# Patient Record
Sex: Male | Born: 1955 | Race: Black or African American | Hispanic: No | Marital: Single | Smoking: Never smoker
Health system: Southern US, Community
[De-identification: ages and names within clinical notes are randomized; demographics above are authoritative.]

## PROBLEM LIST (undated history)

## (undated) DIAGNOSIS — I10 Essential (primary) hypertension: Secondary | ICD-10-CM

## (undated) DIAGNOSIS — E785 Hyperlipidemia, unspecified: Secondary | ICD-10-CM

## (undated) DIAGNOSIS — I639 Cerebral infarction, unspecified: Secondary | ICD-10-CM

## (undated) HISTORY — DX: Essential (primary) hypertension: I10

## (undated) HISTORY — PX: OTHER SURGICAL HISTORY: SHX169

## (undated) HISTORY — DX: Hyperlipidemia, unspecified: E78.5

---

## 2021-04-25 DIAGNOSIS — I10 Essential (primary) hypertension: Secondary | ICD-10-CM | POA: Diagnosis not present

## 2021-04-25 DIAGNOSIS — N529 Male erectile dysfunction, unspecified: Secondary | ICD-10-CM | POA: Diagnosis not present

## 2021-04-25 DIAGNOSIS — G8190 Hemiplegia, unspecified affecting unspecified side: Secondary | ICD-10-CM | POA: Diagnosis not present

## 2021-04-25 DIAGNOSIS — R4781 Slurred speech: Secondary | ICD-10-CM | POA: Diagnosis not present

## 2021-04-27 DIAGNOSIS — I10 Essential (primary) hypertension: Secondary | ICD-10-CM | POA: Diagnosis not present

## 2021-04-27 DIAGNOSIS — Z125 Encounter for screening for malignant neoplasm of prostate: Secondary | ICD-10-CM | POA: Diagnosis not present

## 2021-04-27 DIAGNOSIS — E785 Hyperlipidemia, unspecified: Secondary | ICD-10-CM | POA: Diagnosis not present

## 2021-04-27 DIAGNOSIS — R739 Hyperglycemia, unspecified: Secondary | ICD-10-CM | POA: Diagnosis not present

## 2021-05-09 DIAGNOSIS — G8190 Hemiplegia, unspecified affecting unspecified side: Secondary | ICD-10-CM | POA: Diagnosis not present

## 2021-05-09 DIAGNOSIS — N1831 Chronic kidney disease, stage 3a: Secondary | ICD-10-CM | POA: Diagnosis not present

## 2021-05-09 DIAGNOSIS — I10 Essential (primary) hypertension: Secondary | ICD-10-CM | POA: Diagnosis not present

## 2021-05-09 DIAGNOSIS — R4781 Slurred speech: Secondary | ICD-10-CM | POA: Diagnosis not present

## 2021-05-16 DIAGNOSIS — N1831 Chronic kidney disease, stage 3a: Secondary | ICD-10-CM | POA: Diagnosis not present

## 2021-05-16 DIAGNOSIS — I10 Essential (primary) hypertension: Secondary | ICD-10-CM | POA: Diagnosis not present

## 2021-05-16 DIAGNOSIS — G8190 Hemiplegia, unspecified affecting unspecified side: Secondary | ICD-10-CM | POA: Diagnosis not present

## 2021-05-17 ENCOUNTER — Emergency Department (HOSPITAL_COMMUNITY): Payer: Medicare HMO

## 2021-05-17 ENCOUNTER — Other Ambulatory Visit: Payer: Self-pay

## 2021-05-17 ENCOUNTER — Encounter (HOSPITAL_COMMUNITY): Payer: Self-pay

## 2021-05-17 ENCOUNTER — Emergency Department (HOSPITAL_COMMUNITY)
Admission: EM | Admit: 2021-05-17 | Discharge: 2021-05-17 | Disposition: A | Discharge status: Home / Self Care | Payer: Medicare HMO | Attending: Emergency Medicine | Admitting: Emergency Medicine

## 2021-05-17 DIAGNOSIS — R4020 Unspecified coma: Secondary | ICD-10-CM | POA: Diagnosis not present

## 2021-05-17 DIAGNOSIS — I10 Essential (primary) hypertension: Secondary | ICD-10-CM | POA: Diagnosis not present

## 2021-05-17 DIAGNOSIS — Y900 Blood alcohol level of less than 20 mg/100 ml: Secondary | ICD-10-CM | POA: Diagnosis not present

## 2021-05-17 DIAGNOSIS — R55 Syncope and collapse: Secondary | ICD-10-CM

## 2021-05-17 DIAGNOSIS — Z20822 Contact with and (suspected) exposure to covid-19: Secondary | ICD-10-CM | POA: Diagnosis not present

## 2021-05-17 DIAGNOSIS — N289 Disorder of kidney and ureter, unspecified: Secondary | ICD-10-CM | POA: Insufficient documentation

## 2021-05-17 DIAGNOSIS — W19XXXA Unspecified fall, initial encounter: Secondary | ICD-10-CM | POA: Diagnosis not present

## 2021-05-17 DIAGNOSIS — R0689 Other abnormalities of breathing: Secondary | ICD-10-CM | POA: Diagnosis not present

## 2021-05-17 HISTORY — DX: Cerebral infarction, unspecified: I63.9

## 2021-05-17 LAB — CBC WITH DIFFERENTIAL/PLATELET
Abs Immature Granulocytes: 0.02 10*3/uL (ref 0.00–0.07)
Basophils Absolute: 0 10*3/uL (ref 0.0–0.1)
Basophils Relative: 1 %
Eosinophils Absolute: 0.1 10*3/uL (ref 0.0–0.5)
Eosinophils Relative: 1 %
HCT: 47 % (ref 39.0–52.0)
Hemoglobin: 15.4 g/dL (ref 13.0–17.0)
Immature Granulocytes: 0 %
Lymphocytes Relative: 33 %
Lymphs Abs: 2.5 10*3/uL (ref 0.7–4.0)
MCH: 30.8 pg (ref 26.0–34.0)
MCHC: 32.8 g/dL (ref 30.0–36.0)
MCV: 94 fL (ref 80.0–100.0)
Monocytes Absolute: 0.8 10*3/uL (ref 0.1–1.0)
Monocytes Relative: 10 %
Neutro Abs: 4.2 10*3/uL (ref 1.7–7.7)
Neutrophils Relative %: 55 %
Platelets: 257 10*3/uL (ref 150–400)
RBC: 5 MIL/uL (ref 4.22–5.81)
RDW: 14.5 % (ref 11.5–15.5)
WBC: 7.5 10*3/uL (ref 4.0–10.5)
nRBC: 0 % (ref 0.0–0.2)

## 2021-05-17 LAB — COMPREHENSIVE METABOLIC PANEL
ALT: 21 U/L (ref 0–44)
AST: 21 U/L (ref 15–41)
Albumin: 4.2 g/dL (ref 3.5–5.0)
Alkaline Phosphatase: 68 U/L (ref 38–126)
Anion gap: 9 (ref 5–15)
BUN: 31 mg/dL — ABNORMAL HIGH (ref 8–23)
CO2: 27 mmol/L (ref 22–32)
Calcium: 9.2 mg/dL (ref 8.9–10.3)
Chloride: 102 mmol/L (ref 98–111)
Creatinine, Ser: 2.09 mg/dL — ABNORMAL HIGH (ref 0.61–1.24)
GFR, Estimated: 34 mL/min — ABNORMAL LOW (ref >60)
Glucose, Bld: 102 mg/dL — ABNORMAL HIGH (ref 70–99)
Potassium: 3.6 mmol/L (ref 3.5–5.1)
Sodium: 138 mmol/L (ref 135–145)
Total Bilirubin: 1.4 mg/dL — ABNORMAL HIGH (ref 0.3–1.2)
Total Protein: 8 g/dL (ref 6.5–8.1)

## 2021-05-17 LAB — RESP PANEL BY RT-PCR (FLU A&B, COVID) ARPGX2
Influenza A by PCR: NEGATIVE
Influenza B by PCR: NEGATIVE
SARS Coronavirus 2 by RT PCR: NEGATIVE

## 2021-05-17 LAB — ETHANOL: Alcohol, Ethyl (B): 45 mg/dL — ABNORMAL HIGH (ref <10)

## 2021-05-17 LAB — LIPASE, BLOOD: Lipase: 26 U/L (ref 11–51)

## 2021-05-17 LAB — TROPONIN I (HIGH SENSITIVITY)
Troponin I (High Sensitivity): 66 ng/L — ABNORMAL HIGH (ref <18)
Troponin I (High Sensitivity): 61 ng/L — ABNORMAL HIGH (ref <18)

## 2021-05-17 MED ORDER — SODIUM CHLORIDE 0.9 % IV BOLUS
1000.0000 mL | Freq: Once | INTRAVENOUS | Status: AC
  Administered 2021-05-17: 1000 mL via INTRAVENOUS

## 2021-05-17 NOTE — Discharge Instructions (Addendum)
You were seen in the emergency room today after passing out.  I suspect that you got dehydrated.  Your lab work here shows some weakening of your kidneys.  I do not have lab work to compare to and I would like for you to call your primary care doctor to compare to lab work they did 1 week ago.  I suspect this is chronic with your elevated blood pressure history.  I have also referred you to a cardiology team.  They should be calling you for an appointment.  They do have an office here in [place] but also in [place].  If you develop any new or suddenly worsening symptoms please return to the emergency department.

## 2021-05-17 NOTE — ED Notes (Signed)
Made Dr. Lenna Sciara. Long aware via secure messaging of pt systolic BP sustaining in 180s, he reports this is his baseline and that he has not had his night dose of BP meds. Orthostatic VS being obtained now by NT. MD aware, no new orders at this time.

## 2021-05-17 NOTE — ED Notes (Signed)
Attempted orthostatic vital signs.  Was able to get laying and sitting.  Patient was not stable in attempting to get up - patient stated "I only had 1 beer".  Did not attempt to get patient to stand

## 2021-05-17 NOTE — ED Triage Notes (Signed)
Pt presents to ED via [facility] for syncopal episode. Pt was sitting outside and had LOC. Pt states he has been sitting outside drinking and went to get up. CBG 160.

## 2021-05-17 NOTE — ED Provider Notes (Signed)
Emergency Department Provider Note   I have reviewed the triage vital signs and the nursing notes.   HISTORY  Chief Complaint Loss of Consciousness   HPI Lee Robinson is a 65 y.o. male with prior history of stroke presents to the emergency department with episode of syncope.  Patient states that he is new to the area and has recently establish care with a PCP (Dr. Legrand Rams).  He is compliant with his new blood pressure medications and states has been going well.  He was sitting out on the porch, in the heat, talking with friends and drinking a beer when he had a syncope event.  He denies passing out while standing.  He did not have pain in the chest, palpitations, shortness of breath prior to passing out.  No fevers or chills.  He denies any pain currently.  He rolled to his right side.  Friends did not report seizure activity. No prior history of syncope.   Past Medical History:  Diagnosis Date   Stroke [facility])     There are no problems to display for this patient.   History reviewed. No pertinent surgical history.  Allergies Patient has no known allergies.  History reviewed. No pertinent family history.  Social History Social History   Tobacco Use   Smoking status: Never   Smokeless tobacco: Never  Substance Use Topics   Alcohol use: Yes   Drug use: Never    Review of Systems  Constitutional: No fever/chills Eyes: No visual changes. ENT: No sore throat. Cardiovascular: Denies chest pain. Positive syncope.  Respiratory: Denies shortness of breath. Gastrointestinal: No abdominal pain.  No nausea, no vomiting.  No diarrhea.  No constipation. Genitourinary: Negative for dysuria. Musculoskeletal: Negative for back pain. Skin: Negative for rash. Neurological: Negative for headaches, focal weakness or numbness.  10-point ROS otherwise negative.  ____________________________________________   PHYSICAL EXAM:  VITAL SIGNS: ED Triage Vitals  Enc Vitals Group      BP 05/17/21 1553 (!) 146/82     Pulse Rate 05/17/21 1553 72     Resp 05/17/21 1553 14     Temp 05/17/21 1556 98.2 F (36.8 C)     Temp Source 05/17/21 1556 Oral     SpO2 05/17/21 1553 94 %     Weight 05/17/21 1548 222 lb (100.7 kg)     Height 05/17/21 1548 6\' 2"  (1.88 m)   Constitutional: Alert and oriented. Well appearing and in no acute distress. Eyes: Conjunctivae are normal.  Head: Atraumatic. Nose: No congestion/rhinnorhea. Mouth/Throat: Mucous membranes are slightly dry.  Neck: No stridor.  Cardiovascular: Normal rate, regular rhythm. Good peripheral circulation. Grossly normal heart sounds.   Respiratory: Normal respiratory effort.  No retractions. Lungs CTAB. Gastrointestinal: Soft and nontender. No distention.  Musculoskeletal: No lower extremity tenderness nor edema. No gross deformities of extremities. Neurologic:  Baseline dysarthria. Mild right arm/leg weakness baseline from prior CVA.  Skin:  Skin is warm, dry and intact. No rash noted.   ____________________________________________   LABS (all labs ordered are listed, but only abnormal results are displayed)  Labs Reviewed  COMPREHENSIVE METABOLIC PANEL - Abnormal; Notable for the following components:      Result Value   Glucose, Bld 102 (*)    BUN 31 (*)    Creatinine, Ser 2.09 (*)    Total Bilirubin 1.4 (*)    GFR, Estimated 34 (*)    All other components within normal limits  ETHANOL - Abnormal; Notable for the following components:  Alcohol, Ethyl (B) 45 (*)    All other components within normal limits  TROPONIN I (HIGH SENSITIVITY) - Abnormal; Notable for the following components:   Troponin I (High Sensitivity) 66 (*)    All other components within normal limits  TROPONIN I (HIGH SENSITIVITY) - Abnormal; Notable for the following components:   Troponin I (High Sensitivity) 61 (*)    All other components within normal limits  RESP PANEL BY RT-PCR (FLU A&B, COVID) ARPGX2  LIPASE, BLOOD  CBC WITH  DIFFERENTIAL/PLATELET   ____________________________________________  EKG   EKG Interpretation  Date/Time:  Wednesday May 17 2021 15:53:04 EDT Ventricular Rate:  69 PR Interval:  194 QRS Duration: 102 QT Interval:  410 QTC Calculation: 440 R Axis:   74 Text Interpretation: Sinus rhythm Probable anteroseptal infarct, recent Abnormal T, consider ischemia, diffuse leads Lateral leads are also involved No old tracing to compare Confirmed by Nanda Quinton [phone]) on 05/17/2021 3:59:37 PM         ____________________________________________  RADIOLOGY  DG Chest Portable 1 View  Result Date: 05/17/2021 CLINICAL DATA:  Syncopal episode.  Loss of consciousness. EXAM: PORTABLE CHEST 1 VIEW COMPARISON:  None. FINDINGS: 1635 hours. Lordotic positioning. The heart size and mediastinal contours are normal. The lungs are clear. There is no pleural effusion or pneumothorax. No acute osseous findings are identified. Telemetry leads overlie the chest. IMPRESSION: No active cardiopulmonary process. Electronically Signed   By: Richardean Sale M.D.   On: 05/17/2021 16:49    ____________________________________________   PROCEDURES  Procedure(s) performed:   Procedures  None  ____________________________________________   INITIAL IMPRESSION / ASSESSMENT AND PLAN / ED COURSE  Pertinent labs & imaging results that were available during my care of the patient were reviewed by me and considered in my medical decision making (see chart for details).   Patient presents to the emergency department after having a syncope event.  He has been outside talking with friends and temperatures today approaching 100 F. Reports drinking a single beer today. Does not appear intoxicated. Neuro deficits baseline from prior CVA. Lower suspicion for cardiogenic syncope but will follow workup.   Patient's lab work reviewed.  He has a creatinine of 2.09.  No baseline for comparison.  Patient has history of chronic,  uncontrolled hypertension.  Suspect this is chronic.  Potassium is normal.  Patient is urinating normally.  He is feeling better after IV fluids and some sobering here.  COVID and flu are negative.  His initial troponin came back mildly elevated at 66 and trended out to 61. CXR clear.   We discussed observational admission and syncope eval here.  Patient states he is feeling much better and prefer to go home.  He had lab work drawn by his PCP last week but I do not have access to these results.  He would prefer to call in the morning to compare them to those results.  I have placed a referral to cardiology given his syncope.  He states in [place] he saw a cardiologist less than 6 months ago and was cleared but is unsure exactly which tests were performed at that time.  We discussed strict ED return precautions.  ____________________________________________  FINAL CLINICAL IMPRESSION(S) / ED DIAGNOSES  Final diagnoses:  Syncope and collapse  Primary hypertension  Renal insufficiency    MEDICATIONS GIVEN DURING THIS VISIT:  Medications  sodium chloride 0.9 % bolus 1,000 mL (0 mLs Intravenous Stopped 05/17/21 1930)    Note:  This document was prepared  using Systems analyst and may include unintentional dictation errors.  Nanda Quinton, MD, [facility] Emergency Medicine    Layten Aiken, [facility], MD 05/17/21 2203

## 2021-05-22 DIAGNOSIS — I10 Essential (primary) hypertension: Secondary | ICD-10-CM | POA: Diagnosis not present

## 2021-05-22 DIAGNOSIS — R55 Syncope and collapse: Secondary | ICD-10-CM | POA: Diagnosis not present

## 2021-05-22 DIAGNOSIS — E860 Dehydration: Secondary | ICD-10-CM | POA: Diagnosis not present

## 2021-05-22 DIAGNOSIS — N189 Chronic kidney disease, unspecified: Secondary | ICD-10-CM | POA: Diagnosis not present

## 2021-05-25 NOTE — Progress Notes (Signed)
CARDIOLOGY CONSULT NOTE       Patient ID: Sender Rueb MRN: [id number] DOB/AGE: Sep 23, 1956 65 y.o.  Admit date: (Not on file) Referring Physician: [place] Primary Physician: Rosita Fire, MD Primary Cardiologist: New Reason for Consultation: Syncope  Active Problems:   * No active hospital problems. *   HPI:  65 y.o. referred by Dr Legrand Rams for chest pain. Seen in [facility] ED 05/17/21 for syncope. History of stroke. Started on new BP meds Sitting on porch in heat with a beer talking with friends  Alcohol level elevated 45 in ER Not clear if he went out totally did not fall but rolled to right No palpitations , dyspnea, chest pain or seizure activity Labs remarkable for Cr 2.0. Likely from chronic poorly controlled HTN.  Felt better after hydration in ER Respiratory panel negative Troponin's no trend 66->61  CXR NAD He just moved from [place] and noted seeing a Cardiologist 6 months ago with no abnormal findings   I reviewed records from [facility] and [facility] [phone]  Myovue 08/23/19 ? Inferior ischemia vs diaphragm attenuation EF 45% Stress echo 08/03/19 normal With no RWMAls US abdomen normal kidneys no AAA   Stroke 2 years ago ? From HTN.  Dysarthria and right sided hemiparesis Did good job with rehab And is very ambulatory and speaks much better now   He moved himself from [place] due to cost of living No family here. On disability and gets money From being in [employer]. Likes to restore cars   No angina, palpitations , dyspnea No recurrent syncope   ROS All other systems reviewed and negative except as noted above  Past Medical History:  Diagnosis Date   Stroke [facility])     No family history on file.  Social History   Socioeconomic History   Marital status: Single    Spouse name: Not on file   Number of children: Not on file   Years of education: Not on file   Highest education level: Not on file  Occupational History   Not on file   Tobacco Use   Smoking status: Never   Smokeless tobacco: Never  Substance and Sexual Activity   Alcohol use: Yes   Drug use: Never   Sexual activity: Not on file  Other Topics Concern   Not on file  Social History Narrative   Not on file   Social Determinants of Health   Financial Resource Strain: Not on file  Food Insecurity: Not on file  Transportation Needs: Not on file  Physical Activity: Not on file  Stress: Not on file  Social Connections: Not on file  Intimate Partner Violence: Not on file    No past surgical history on file.    Current Outpatient Medications:    amLODipine (NORVASC) 10 MG tablet, Take 1 tablet by mouth daily., Disp: , Rfl:    aspirin 81 MG EC tablet, Take 81 mg by mouth daily., Disp: , Rfl:    atorvastatin (LIPITOR) 20 MG tablet, Take 1 tablet by mouth at bedtime., Disp: , Rfl:    losartan-hydrochlorothiazide (HYZAAR) 50-12.5 MG tablet, Take 1 tablet by mouth daily., Disp: , Rfl:     Physical Exam:  There were no vitals taken for this visit.   Affect appropriate Healthy:  appears stated age 17: normal Neck supple with no adenopathy JVP normal no bruits no thyromegaly Lungs clear with no wheezing and good diaphragmatic motion Heart:  S1/S2 no murmur, no rub, gallop  or click PMI normal Abdomen: benighn, BS positve, no tenderness, no AAA no bruit.  No HSM or HJR Distal pulses intact with no bruits No edema Neuro minimal right sided weakness and dysarthria  Skin warm and dry No muscular weakness   Labs:   Lab Results  Component Value Date   WBC 7.5 05/17/2021   HGB 15.4 05/17/2021   HCT 47.0 05/17/2021   MCV 94.0 05/17/2021   PLT 257 05/17/2021   No results for input(s): NA, K, CL, CO2, BUN, CREATININE, CALCIUM, PROT, BILITOT, ALKPHOS, ALT, AST, GLUCOSE in the last 168 hours.  Invalid input(s): LABALBU No results found for: CKTOTAL, CKMB, CKMBINDEX, TROPONINI No results found for: CHOL No results found for: HDL No results  found for: LDLCALC No results found for: TRIG No results found for: CHOLHDL No results found for: LDLDIRECT    Radiology: DG Chest Portable 1 View  Result Date: 05/17/2021 CLINICAL DATA:  Syncopal episode.  Loss of consciousness. EXAM: PORTABLE CHEST 1 VIEW COMPARISON:  None. FINDINGS: 1635 hours. Lordotic positioning. The heart size and mediastinal contours are normal. The lungs are clear. There is no pleural effusion or pneumothorax. No acute osseous findings are identified. Telemetry leads overlie the chest. IMPRESSION: No active cardiopulmonary process. Electronically Signed   By: Richardean Sale M.D.   On: 05/17/2021 16:49    EKG: SR rate 69 LVH with strain 05/18/21    ASSESSMENT AND PLAN:   Syncope: appears to have been in setting ETOH, 100 degree heat and dehydration with improvement iv fluid and BUN31 Cr 2.09.  He has had cardiac testing in [place] and is asymptomatic now will f/u in 6 months HTN:  signs of poor control with elevated Cr and strain on ECG Continue Hyzaar and norvasc started by primary  HLD:  on statin f/u labs primary  CRF:  should have referral to nephrology related to HTN  CVA: no carotid bruit ? HTN great rehab with fairly normal strength RU/RL extremities Residual dysarthria But speech very understandable No carotid bruits   Consider f/u echo in 6 months   Signed: Jenkins Rouge 05/25/2021, 3:10 PM

## 2021-06-07 ENCOUNTER — Encounter: Payer: Self-pay | Admitting: Cardiovascular Disease

## 2021-06-07 ENCOUNTER — Ambulatory Visit: Payer: Medicare HMO | Admitting: Cardiovascular Disease

## 2021-06-07 ENCOUNTER — Other Ambulatory Visit: Payer: Self-pay

## 2021-06-07 VITALS — BP 156/70 | HR 84 | Ht 74.0 in | Wt 224.0 lb

## 2021-06-07 DIAGNOSIS — I63312 Cerebral infarction due to thrombosis of left middle cerebral artery: Secondary | ICD-10-CM

## 2021-06-07 DIAGNOSIS — I10 Essential (primary) hypertension: Secondary | ICD-10-CM | POA: Diagnosis not present

## 2021-06-07 DIAGNOSIS — R55 Syncope and collapse: Secondary | ICD-10-CM | POA: Diagnosis not present

## 2021-06-07 DIAGNOSIS — N1832 Chronic kidney disease, stage 3b: Secondary | ICD-10-CM

## 2021-06-07 NOTE — Patient Instructions (Signed)
Medication Instructions:  Your physician recommends that you continue on your current medications as directed. Please refer to the Current Medication list given to you today.  *If you need a refill on your cardiac medications before your next appointment, please call your pharmacy*   Lab Work: None today  If you have labs (blood work) drawn today and your tests are completely normal, you will receive your results only by: [place] (if you have MyChart) OR A paper copy in the mail If you have any lab test that is abnormal or we need to change your treatment, we will call you to review the results.   Testing/Procedures: None today    Follow-Up: At [facility], you and your health needs are our priority.  As part of our continuing mission to provide you with exceptional heart care, we have created designated Provider Care Teams.  These Care Teams include your primary Cardiologist (physician) and Advanced Practice Providers (APPs -  Physician Assistants and Nurse Practitioners) who all work together to provide you with the care you need, when you need it.  We recommend signing up for the patient portal called "MyChart".  Sign up information is provided on this After Visit Summary.  MyChart is used to connect with patients for Virtual Visits (Telemedicine).  Patients are able to view lab/test results, encounter notes, upcoming appointments, etc.  Non-urgent messages can be sent to your provider as well.   To learn more about what you can do with MyChart, go to [email or web address].    Your next appointment:   6 month(s)  The format for your next appointment:   In Person  Provider:   Jenkins Rouge, MD   Other Instructions None

## 2021-06-15 DIAGNOSIS — I10 Essential (primary) hypertension: Secondary | ICD-10-CM | POA: Diagnosis not present

## 2021-06-15 DIAGNOSIS — N529 Male erectile dysfunction, unspecified: Secondary | ICD-10-CM | POA: Diagnosis not present

## 2021-06-15 DIAGNOSIS — R4781 Slurred speech: Secondary | ICD-10-CM | POA: Diagnosis not present

## 2021-07-05 DIAGNOSIS — G8190 Hemiplegia, unspecified affecting unspecified side: Secondary | ICD-10-CM | POA: Diagnosis not present

## 2021-07-05 DIAGNOSIS — I10 Essential (primary) hypertension: Secondary | ICD-10-CM | POA: Diagnosis not present

## 2021-07-05 DIAGNOSIS — N529 Male erectile dysfunction, unspecified: Secondary | ICD-10-CM | POA: Diagnosis not present

## 2021-07-05 DIAGNOSIS — N1831 Chronic kidney disease, stage 3a: Secondary | ICD-10-CM | POA: Diagnosis not present

## 2021-07-19 ENCOUNTER — Other Ambulatory Visit: Payer: Self-pay | Admitting: Nephrology

## 2021-07-19 ENCOUNTER — Other Ambulatory Visit (HOSPITAL_COMMUNITY): Payer: Self-pay | Admitting: Nephrology

## 2021-07-19 DIAGNOSIS — N1832 Chronic kidney disease, stage 3b: Secondary | ICD-10-CM | POA: Diagnosis not present

## 2021-07-19 DIAGNOSIS — N170 Acute kidney failure with tubular necrosis: Secondary | ICD-10-CM

## 2021-07-19 DIAGNOSIS — N2581 Secondary hyperparathyroidism of renal origin: Secondary | ICD-10-CM | POA: Diagnosis not present

## 2021-07-19 DIAGNOSIS — I129 Hypertensive chronic kidney disease with stage 1 through stage 4 chronic kidney disease, or unspecified chronic kidney disease: Secondary | ICD-10-CM | POA: Diagnosis not present

## 2021-07-19 DIAGNOSIS — Z79899 Other long term (current) drug therapy: Secondary | ICD-10-CM | POA: Diagnosis not present

## 2021-07-19 DIAGNOSIS — E6609 Other obesity due to excess calories: Secondary | ICD-10-CM | POA: Diagnosis not present

## 2021-07-21 DIAGNOSIS — E6609 Other obesity due to excess calories: Secondary | ICD-10-CM | POA: Diagnosis not present

## 2021-07-21 DIAGNOSIS — N170 Acute kidney failure with tubular necrosis: Secondary | ICD-10-CM | POA: Diagnosis not present

## 2021-07-21 DIAGNOSIS — I129 Hypertensive chronic kidney disease with stage 1 through stage 4 chronic kidney disease, or unspecified chronic kidney disease: Secondary | ICD-10-CM | POA: Diagnosis not present

## 2021-07-21 DIAGNOSIS — D631 Anemia in chronic kidney disease: Secondary | ICD-10-CM | POA: Diagnosis not present

## 2021-07-21 DIAGNOSIS — Z79899 Other long term (current) drug therapy: Secondary | ICD-10-CM | POA: Diagnosis not present

## 2021-07-21 DIAGNOSIS — N1832 Chronic kidney disease, stage 3b: Secondary | ICD-10-CM | POA: Diagnosis not present

## 2021-07-26 ENCOUNTER — Ambulatory Visit (HOSPITAL_COMMUNITY)
Admission: RE | Admit: 2021-07-26 | Discharge: 2021-07-26 | Disposition: A | Discharge status: Home / Self Care | Payer: Medicare HMO | Source: Ambulatory Visit | Attending: Nephrology | Admitting: Nephrology

## 2021-07-26 ENCOUNTER — Other Ambulatory Visit: Payer: Self-pay

## 2021-07-26 DIAGNOSIS — N170 Acute kidney failure with tubular necrosis: Secondary | ICD-10-CM | POA: Insufficient documentation

## 2021-07-26 DIAGNOSIS — N1832 Chronic kidney disease, stage 3b: Secondary | ICD-10-CM | POA: Diagnosis not present

## 2021-07-26 DIAGNOSIS — N2889 Other specified disorders of kidney and ureter: Secondary | ICD-10-CM | POA: Diagnosis not present

## 2021-08-05 DIAGNOSIS — G8190 Hemiplegia, unspecified affecting unspecified side: Secondary | ICD-10-CM | POA: Diagnosis not present

## 2021-08-05 DIAGNOSIS — I10 Essential (primary) hypertension: Secondary | ICD-10-CM | POA: Diagnosis not present

## 2021-08-23 DIAGNOSIS — I129 Hypertensive chronic kidney disease with stage 1 through stage 4 chronic kidney disease, or unspecified chronic kidney disease: Secondary | ICD-10-CM | POA: Diagnosis not present

## 2021-08-23 DIAGNOSIS — D472 Monoclonal gammopathy: Secondary | ICD-10-CM | POA: Diagnosis not present

## 2021-08-23 DIAGNOSIS — R7303 Prediabetes: Secondary | ICD-10-CM | POA: Diagnosis not present

## 2021-08-23 DIAGNOSIS — N170 Acute kidney failure with tubular necrosis: Secondary | ICD-10-CM | POA: Diagnosis not present

## 2021-08-23 DIAGNOSIS — N1831 Chronic kidney disease, stage 3a: Secondary | ICD-10-CM | POA: Diagnosis not present

## 2021-08-23 DIAGNOSIS — E6609 Other obesity due to excess calories: Secondary | ICD-10-CM | POA: Diagnosis not present

## 2021-09-04 DIAGNOSIS — N1831 Chronic kidney disease, stage 3a: Secondary | ICD-10-CM | POA: Diagnosis not present

## 2021-09-04 DIAGNOSIS — I10 Essential (primary) hypertension: Secondary | ICD-10-CM | POA: Diagnosis not present

## 2021-09-07 ENCOUNTER — Inpatient Hospital Stay (HOSPITAL_COMMUNITY): Payer: Medicare HMO | Admitting: Hematology

## 2021-09-07 NOTE — Progress Notes (Addendum)
[facility] [address], [place] [address]   CLINIC:  Medical Oncology/Hematology  Patient Care Team: Rosita Fire, MD as PCP - General (Internal Medicine) Derek Jack, MD as Medical Oncologist (Hematology)  CHIEF COMPLAINTS/PURPOSE OF CONSULTATION:  Evaluation of MGUS  HISTORY OF PRESENTING ILLNESS:  Lee Robinson 65 y.o. male is here because of evaluation of MGUS, at the request of Dr. Theador Hawthorne.  Today he reports feeling good. Following prior CVA caused by history of HTN, he reports continued weakness on the right side of his body; this caused weakness in his right hand as well as difficulty talking. He also reports numbness in the little finger of his right hand. He reports intentionally losing about 20 pounds. He moved to [place] from [place] 1 year ago, and he currently lives at home by himself. 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 He denies smoking history. He is adopted and is not aware of any family history of cancer. His biological mother had CVA.   MEDICAL HISTORY:  Past Medical History:  Diagnosis Date   Stroke [facility])     SURGICAL HISTORY: No past surgical history on file.  SOCIAL HISTORY: Social History   Socioeconomic History   Marital status: Single    Spouse name: Not on file   Number of children: Not on file   Years of education: Not on file   Highest education level: Not on file  Occupational History   Not on file  Tobacco Use   Smoking status: Never   Smokeless tobacco: Never  Vaping Use   Vaping Use: Never used  Substance and Sexual Activity   Alcohol use: Yes   Drug use: Never   Sexual activity: Not on file  Other Topics Concern   Not on file  Social History Narrative   Not on file   Social Determinants of Health   Financial Resource Strain: Not on file  Food Insecurity: Not on file  Transportation Needs: Not on file   Physical Activity: Not on file  Stress: Not on file  Social Connections: Not on file  Intimate Partner Violence: Not on file    FAMILY HISTORY: No family history on file.  ALLERGIES:  has No Known Allergies.  MEDICATIONS:  Current Outpatient Medications  Medication Sig Dispense Refill   amLODipine (NORVASC) 10 MG tablet Take 1 tablet by mouth daily.     aspirin 81 MG EC tablet Take 81 mg by mouth daily.     atorvastatin (LIPITOR) 20 MG tablet Take 1 tablet by mouth at bedtime.     losartan-hydrochlorothiazide (HYZAAR) 50-12.5 MG tablet Take 1 tablet by mouth daily.     No current facility-administered medications for this visit.    REVIEW OF SYSTEMS:   Review of Systems  Constitutional:  Negative for appetite change, fatigue and unexpected weight change.  Neurological:  Positive for extremity weakness (R side of body) and numbness (R little finger).  All other systems reviewed and are negative.   PHYSICAL EXAMINATION: ECOG PERFORMANCE STATUS: 1 - Symptomatic but completely ambulatory  Vitals:   09/08/21 0806  BP: 139/73  Pulse: 83  Resp: 16  Temp: 98 F (36.7 C)  SpO2: 100%   Filed Weights   09/08/21 0806  Weight: 236 lb 1.6 oz (107.1 kg)   Physical Exam Vitals reviewed.  Constitutional:      Appearance: Normal appearance.  Cardiovascular:  Rate and Rhythm: Normal rate and regular rhythm.     Pulses: Normal pulses.     Heart sounds: Normal heart sounds.  Pulmonary:     Effort: Pulmonary effort is normal.     Breath sounds: Normal breath sounds.  Abdominal:     Palpations: Abdomen is soft. There is no hepatomegaly, splenomegaly or mass.     Tenderness: There is no abdominal tenderness.  Musculoskeletal:     Right lower leg: No edema.     Left lower leg: No edema.  Lymphadenopathy:     Cervical: No cervical adenopathy.     Right cervical: No superficial cervical adenopathy.    Left cervical: No superficial cervical adenopathy.     Upper Body:      Right upper body: No supraclavicular, axillary or pectoral adenopathy.     Left upper body: No supraclavicular, axillary or pectoral adenopathy.  Neurological:     General: No focal deficit present.     Mental Status: He is alert and oriented to person, place, and time.  Psychiatric:        Mood and Affect: Mood normal.        Behavior: Behavior normal.     LABORATORY DATA:  I have reviewed the data as listed No results found for this or any previous visit (from the past 2160 hour(s)).  RADIOGRAPHIC STUDIES: I have personally reviewed the radiological images as listed and agreed with the findings in the report. No results found.  ASSESSMENT:  IgG kappa MGUS: - Patient seen at the request of Dr. Theador Hawthorne for work-up of MGUS. - Labs done at Dr. Toya Smothers office on 07/21/2021 showed M spike 1.5 g, immunofixation IgG kappa. - Creatinine was 1.45, calcium 9.4 and almond 4.4. - 24-hour urine with less than 150 mg of protein. - CVA with right hemiparesis 2 years ago, with some remaining weakness on the right side and aphasia, thought to be secondary to hypertension. - Denies any tingling or numbness.  Right hand little finger is numb from stroke.  No new onset bone pains.  2.  Social/family history: - He lives by himself at home.  Recently moved from [place], [place] in 2021.  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  Non-smoker. - No family history of malignancies or leukemias.   PLAN:  IgG kappa MGUS: -We talked about normal pathophysiology and prognosis of monoclonal gammopathy in detail. - We will check serum free light chains, CBC, CMP, LDH and beta-2 microglobulin today. - We will also order skeletal survey to evaluate for lytic lesions. - RTC 2 weeks for follow-up.  2.  CKD, stage IIIa: - Thought to be secondary to hypertension.  Proteinuria less than 150 mg. - Latest creatinine improved to 1.45.  Continue follow-up with Dr.  Theador Hawthorne.  All questions were answered. The patient knows to call the clinic with any problems, questions or concerns.  Derek Jack, MD 09/08/21 9:19 AM  [place] [phone]   I, Thana Ates, am acting as a scribe for Dr. Derek Jack.  I, Derek Jack MD, have reviewed the above documentation for accuracy and completeness, and I agree with the above.

## 2021-09-08 ENCOUNTER — Other Ambulatory Visit: Payer: Self-pay

## 2021-09-08 ENCOUNTER — Ambulatory Visit (HOSPITAL_COMMUNITY)
Admission: RE | Admit: 2021-09-08 | Discharge: 2021-09-08 | Disposition: A | Discharge status: Home / Self Care | Payer: Medicare HMO | Source: Ambulatory Visit | Attending: Hematology | Admitting: Hematology

## 2021-09-08 ENCOUNTER — Inpatient Hospital Stay (HOSPITAL_COMMUNITY): Payer: Medicare HMO | Attending: Hematology | Admitting: Hematology

## 2021-09-08 ENCOUNTER — Inpatient Hospital Stay (HOSPITAL_COMMUNITY): Payer: Medicare HMO

## 2021-09-08 DIAGNOSIS — D472 Monoclonal gammopathy: Secondary | ICD-10-CM | POA: Diagnosis not present

## 2021-09-08 DIAGNOSIS — N1831 Chronic kidney disease, stage 3a: Secondary | ICD-10-CM | POA: Diagnosis not present

## 2021-09-08 DIAGNOSIS — Z8673 Personal history of transient ischemic attack (TIA), and cerebral infarction without residual deficits: Secondary | ICD-10-CM | POA: Insufficient documentation

## 2021-09-08 DIAGNOSIS — I129 Hypertensive chronic kidney disease with stage 1 through stage 4 chronic kidney disease, or unspecified chronic kidney disease: Secondary | ICD-10-CM | POA: Diagnosis not present

## 2021-09-08 DIAGNOSIS — R809 Proteinuria, unspecified: Secondary | ICD-10-CM | POA: Diagnosis not present

## 2021-09-08 DIAGNOSIS — N189 Chronic kidney disease, unspecified: Secondary | ICD-10-CM | POA: Insufficient documentation

## 2021-09-08 LAB — CBC WITH DIFFERENTIAL/PLATELET
Abs Immature Granulocytes: 0.02 10*3/uL (ref 0.00–0.07)
Basophils Absolute: 0 10*3/uL (ref 0.0–0.1)
Basophils Relative: 1 %
Eosinophils Absolute: 0.1 10*3/uL (ref 0.0–0.5)
Eosinophils Relative: 2 %
HCT: 41.3 % (ref 39.0–52.0)
Hemoglobin: 14 g/dL (ref 13.0–17.0)
Immature Granulocytes: 0 %
Lymphocytes Relative: 34 %
Lymphs Abs: 2.3 10*3/uL (ref 0.7–4.0)
MCH: 32 pg (ref 26.0–34.0)
MCHC: 33.9 g/dL (ref 30.0–36.0)
MCV: 94.5 fL (ref 80.0–100.0)
Monocytes Absolute: 0.6 10*3/uL (ref 0.1–1.0)
Monocytes Relative: 9 %
Neutro Abs: 3.6 10*3/uL (ref 1.7–7.7)
Neutrophils Relative %: 54 %
Platelets: 229 10*3/uL (ref 150–400)
RBC: 4.37 MIL/uL (ref 4.22–5.81)
RDW: 13.8 % (ref 11.5–15.5)
WBC: 6.6 10*3/uL (ref 4.0–10.5)
nRBC: 0 % (ref 0.0–0.2)

## 2021-09-08 LAB — COMPREHENSIVE METABOLIC PANEL
ALT: 22 U/L (ref 0–44)
AST: 25 U/L (ref 15–41)
Albumin: 4.4 g/dL (ref 3.5–5.0)
Alkaline Phosphatase: 79 U/L (ref 38–126)
Anion gap: 7 (ref 5–15)
BUN: 28 mg/dL — ABNORMAL HIGH (ref 8–23)
CO2: 28 mmol/L (ref 22–32)
Calcium: 8.8 mg/dL — ABNORMAL LOW (ref 8.9–10.3)
Chloride: 100 mmol/L (ref 98–111)
Creatinine, Ser: 1.7 mg/dL — ABNORMAL HIGH (ref 0.61–1.24)
GFR, Estimated: 44 mL/min — ABNORMAL LOW (ref >60)
Glucose, Bld: 101 mg/dL — ABNORMAL HIGH (ref 70–99)
Potassium: 3.6 mmol/L (ref 3.5–5.1)
Sodium: 135 mmol/L (ref 135–145)
Total Bilirubin: 1.7 mg/dL — ABNORMAL HIGH (ref 0.3–1.2)
Total Protein: 8.2 g/dL — ABNORMAL HIGH (ref 6.5–8.1)

## 2021-09-08 LAB — LACTATE DEHYDROGENASE: LDH: 148 U/L (ref 98–192)

## 2021-09-08 NOTE — Patient Instructions (Signed)
[place] at [facility] Discharge Instructions  You were seen and examined today by Dr. Delton Coombes. Dr. Delton Coombes is a hematologist, meaning he specializes in the management of blood disorders. Dr. Delton Coombes discussed your past medical history, family history of cancer/blood disorders, and the events that led to you being here today.  You were referred to Dr. Delton Coombes due to an abnormal protein present in your blood. This is concerning because it can progress into a type of cancer or impact your bones. It does require regular monitoring. Dr. Delton Coombes has recommended additional lab work to assess the current state of your blood, as well as the abnormal protein. In addition, Dr. Delton Coombes has recommended a whole body bone X-Ray to get a baseline of your bones now.  Follow-up with Dr. Delton Coombes as scheduled.   Thank you for choosing [place] at [facility] to provide your oncology and hematology care.  To afford each patient quality time with our provider, please arrive at least 15 minutes before your scheduled appointment time.   If you have a lab appointment with the [place] please come in thru the Main Entrance and check in at the main information desk.  You need to re-schedule your appointment should you arrive 10 or more minutes late.  We strive to give you quality time with our providers, and arriving late affects you and other patients whose appointments are after yours.  Also, if you no show three or more times for appointments you may be dismissed from the clinic at the providers discretion.     Again, thank you for choosing [facility].  Our hope is that these requests will decrease the amount of time that you wait before being seen by our physicians.       _____________________________________________________________  Should you have questions after your visit to [facility], please contact our office  at [phone] and follow the prompts.  Our office hours are 8:00 a.m. and 4:30 p.m. Monday - Friday.  Please note that voicemails left after 4:00 p.m. may not be returned until the following business day.  We are closed weekends and major holidays.  You do have access to a nurse 24-7, just call the main number to the clinic [phone] and do not press any options, hold on the line and a nurse will answer the phone.    For prescription refill requests, have your pharmacy contact our office and allow 72 hours.    Due to Covid, you will need to wear a mask upon entering the hospital. If you do not have a mask, a mask will be given to you at the Main Entrance upon arrival. For doctor visits, patients may have 1 support person age 65 or older with them. For treatment visits, patients can not have anyone with them due to social distancing guidelines and our immunocompromised population.

## 2021-09-09 LAB — BETA 2 MICROGLOBULIN, SERUM: Beta-2 Microglobulin: 2.2 mg/L (ref 0.6–2.4)

## 2021-09-11 LAB — KAPPA/LAMBDA LIGHT CHAINS
Kappa free light chain: 34.2 mg/L — ABNORMAL HIGH (ref 3.3–19.4)
Kappa, lambda light chain ratio: 2.12 — ABNORMAL HIGH (ref 0.26–1.65)
Lambda free light chains: 16.1 mg/L (ref 5.7–26.3)

## 2021-09-12 LAB — PROTEIN ELECTROPHORESIS, SERUM
A/G Ratio: 1.1 (ref 0.7–1.7)
Albumin ELP: 4 g/dL (ref 2.9–4.4)
Alpha-1-Globulin: 0.2 g/dL (ref 0.0–0.4)
Alpha-2-Globulin: 0.6 g/dL (ref 0.4–1.0)
Beta Globulin: 0.9 g/dL (ref 0.7–1.3)
Gamma Globulin: 2.1 g/dL — ABNORMAL HIGH (ref 0.4–1.8)
Globulin, Total: 3.8 g/dL (ref 2.2–3.9)
M-Spike, %: 1.6 g/dL — ABNORMAL HIGH
Total Protein ELP: 7.8 g/dL (ref 6.0–8.5)

## 2021-09-22 ENCOUNTER — Ambulatory Visit (HOSPITAL_COMMUNITY): Payer: Medicare HMO | Admitting: Physician Assistant

## 2021-09-23 NOTE — Progress Notes (Signed)
[place], [place] [address]   CLINIC:  Medical Oncology/Hematology  PCP:  Lee Fire, MD [place] / [place]  [id number]  [phone]  REASON FOR VISIT:  Follow-up for MGUS  PRIOR THERAPY: none  CURRENT THERAPY: surveillance  INTERVAL HISTORY:  [name], a 65 y.o. male, returns for routine follow-up for his MGUS. Lee Robinson was last seen on 09/08/2021.  Today he reports feeling good.   REVIEW OF SYSTEMS:  Review of Systems  Constitutional:  Negative for appetite change.  All other systems reviewed and are negative.  PAST MEDICAL/SURGICAL HISTORY:  Past Medical History:  Diagnosis Date   Stroke [facility])    No past surgical history on file.  SOCIAL HISTORY:  Social History   Socioeconomic History   Marital status: Single    Spouse name: Not on file   Number of children: Not on file   Years of education: Not on file   Highest education level: Not on file  Occupational History   Not on file  Tobacco Use   Smoking status: Never   Smokeless tobacco: Never  Vaping Use   Vaping Use: Never used  Substance and Sexual Activity   Alcohol use: Yes   Drug use: Never   Sexual activity: Not on file  Other Topics Concern   Not on file  Social History Narrative   Not on file   Social Determinants of Health   Financial Resource Strain: Not on file  Food Insecurity: Not on file  Transportation Needs: Not on file  Physical Activity: Not on file  Stress: Not on file  Social Connections: Not on file  Intimate Partner Violence: Not on file    FAMILY HISTORY:  No family history on file.  CURRENT MEDICATIONS:  Current Outpatient Medications  Medication Sig Dispense Refill   amLODipine (NORVASC) 10 MG tablet Take 1 tablet by mouth daily.     aspirin 81 MG EC tablet Take 81 mg by mouth daily.     atorvastatin (LIPITOR) 20 MG tablet Take 1 tablet by mouth at bedtime.     losartan-hydrochlorothiazide  (HYZAAR) 50-12.5 MG tablet Take 1 tablet by mouth daily.     No current facility-administered medications for this visit.    ALLERGIES:  No Known Allergies  PHYSICAL EXAM:  Performance status (ECOG): 1 - Symptomatic but completely ambulatory  There were no vitals filed for this visit. Wt Readings from Last 3 Encounters:  09/08/21 236 lb 1.6 oz (107.1 kg)  06/07/21 224 lb (101.6 kg)  05/17/21 222 lb (100.7 kg)   Physical Exam Vitals reviewed.  Constitutional:      Appearance: Normal appearance.  Cardiovascular:     Rate and Rhythm: Normal rate and regular rhythm.     Pulses: Normal pulses.     Heart sounds: Normal heart sounds.  Pulmonary:     Effort: Pulmonary effort is normal.     Breath sounds: Normal breath sounds.  Neurological:     General: No focal deficit present.     Mental Status: He is alert and oriented to person, place, and time.  Psychiatric:        Mood and Affect: Mood normal.        Behavior: Behavior normal.    LABORATORY DATA:  I have reviewed the labs as listed.  CBC Latest Ref Rng & Units 09/08/2021 05/17/2021  WBC 4.0 - 10.5 K/uL 6.6 7.5  Hemoglobin 13.0 - 17.0 g/dL 14.0 15.4  Hematocrit 39.0 - 52.0 % 41.3 47.0  Platelets 150 - 400 K/uL 229 257   CMP Latest Ref Rng & Units 09/08/2021 05/17/2021  Glucose 70 - 99 mg/dL 101(H) 102(H)  BUN 8 - 23 mg/dL 28(H) 31(H)  Creatinine 0.61 - 1.24 mg/dL 1.70(H) 2.09(H)  Sodium 135 - 145 mmol/L 135 138  Potassium 3.5 - 5.1 mmol/L 3.6 3.6  Chloride 98 - 111 mmol/L 100 102  CO2 22 - 32 mmol/L 28 27  Calcium 8.9 - 10.3 mg/dL 8.8(L) 9.2  Total Protein 6.5 - 8.1 g/dL 8.2(H) 8.0  Total Bilirubin 0.3 - 1.2 mg/dL 1.7(H) 1.4(H)  Alkaline Phos 38 - 126 U/L 79 68  AST 15 - 41 U/L 25 21  ALT 0 - 44 U/L 22 21      Component Value Date/Time   RBC 4.37 09/08/2021 0846   MCV 94.5 09/08/2021 0846   MCH 32.0 09/08/2021 0846   MCHC 33.9 09/08/2021 0846   RDW 13.8 09/08/2021 0846   LYMPHSABS 2.3 09/08/2021 0846    MONOABS 0.6 09/08/2021 0846   EOSABS 0.1 09/08/2021 0846   BASOSABS 0.0 09/08/2021 0846    DIAGNOSTIC IMAGING:  I have independently reviewed the scans and discussed with the patient. DG Bone Survey Met  Result Date: 09/11/2021 CLINICAL DATA:  MGUS EXAM: METASTATIC BONE SURVEY COMPARISON:  None. FINDINGS: Images of the axial and appendicular skeleton are performed. Calvarium: No suspicious focal bony lesions. No acute osseous abnormality. Soft tissues are unremarkable. Chest and Abdomen cardiac and mediastinal contours are within normal limits. Lungs are clear. No gas-filled dilated loops of bowel seen in the visualized abdomen and pelvis.No suspicious focal bony lesions. No acute osseous abnormality. Superficial soft tissues are unremarkable. Spine:Multilevel mild-to-moderate degenerative disc disease, most pronounced at C5-C6 and L5-S1.No suspicious focal bony lesions. No acute osseous abnormality. Soft tissues are unremarkable. UPPER extremities:No suspicious focal bony lesions. No acute osseous abnormality. Soft tissues are unremarkable. Pelvis and LOWER extremities:Severe degenerative changes of the right hip joint with subchondral cystic change. Moderate degenerative changes of the left hip joint. Mild degenerative changes of the pubic symphysis. Lucencies overlying the bilateral iliac bones, likely due to overlying bowel gas. No suspicious focal bony lesions. No acute osseous abnormality. Soft tissues are unremarkable. IMPRESSION: No suspicious focal bony lesions. Electronically Signed   By: Lee Robinson M.D.   On: 09/11/2021 09:27     ASSESSMENT:  IgG kappa MGUS: - Patient seen at the request of Dr. Theador Robinson for work-up of MGUS. - Labs done at Dr. Toya Robinson office on 07/21/2021 showed M spike 1.5 g, immunofixation IgG kappa. - Creatinine was 1.45, calcium 9.4 and almond 4.4. - 24-hour urine with less than 150 mg of protein. - CVA with right hemiparesis 2 years ago, with some remaining  weakness on the right side and aphasia, thought to be secondary to hypertension. - Denies any tingling or numbness.  Right hand little finger is numb from stroke.  No new onset bone pains.  2.  Social/family history: - He lives by himself at home.  Recently moved from [place], [place] in 2021.  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  Non-smoker. - No family history of malignancies or leukemias.   PLAN:  IgG kappa MGUS: - We reviewed labs from 09/08/2021.  M spike is 1.6 g.  Free light chain ratio is 2.12 with kappa light chains 34.2. - Skeletal survey was negative for lytic lesions on 09/08/2021. - He  does not have "crab" features. - We have reviewed normal pathophysiology of MGUS with 1% progression to myeloma per year. - We will see him back in 4 months for follow-up with repeat labs.  If everything stable at that time, we will switch him to 62-monthvisits.  2.  CKD, stage IIIa: - Thought to be secondary to hypertension.  Proteinuria less than 150 mg. - Continue follow-up with Dr. BTheador Robinson   All questions were answered. The patient knows to call the clinic with any problems, questions or concerns.  Orders placed this encounter:  No orders of the defined types were placed in this encounter.    SDerek Jack MD [phone]  I, KThana Ates am acting as a scribe for Dr. SDerek Jack  I, SDerek JackMD, have reviewed the above documentation for accuracy and completeness, and I agree with the above.

## 2021-09-25 ENCOUNTER — Other Ambulatory Visit: Payer: Self-pay

## 2021-09-25 ENCOUNTER — Inpatient Hospital Stay (HOSPITAL_BASED_OUTPATIENT_CLINIC_OR_DEPARTMENT_OTHER): Payer: Medicare HMO | Admitting: Hematology

## 2021-09-25 VITALS — BP 160/83 | HR 77 | Temp 97.4°F | Resp 18 | Wt 239.0 lb

## 2021-09-25 DIAGNOSIS — D472 Monoclonal gammopathy: Secondary | ICD-10-CM | POA: Insufficient documentation

## 2021-09-25 DIAGNOSIS — R809 Proteinuria, unspecified: Secondary | ICD-10-CM | POA: Diagnosis not present

## 2021-09-25 DIAGNOSIS — Z8673 Personal history of transient ischemic attack (TIA), and cerebral infarction without residual deficits: Secondary | ICD-10-CM | POA: Insufficient documentation

## 2021-09-25 DIAGNOSIS — I129 Hypertensive chronic kidney disease with stage 1 through stage 4 chronic kidney disease, or unspecified chronic kidney disease: Secondary | ICD-10-CM | POA: Diagnosis not present

## 2021-09-25 DIAGNOSIS — N1831 Chronic kidney disease, stage 3a: Secondary | ICD-10-CM | POA: Diagnosis not present

## 2021-09-25 NOTE — Patient Instructions (Addendum)
[place] at [facility] Discharge Instructions  You were seen and examined by Dr. Delton Coombes. He reviewed your lab work with you today. Return as scheduled in 6 months with lab work and office visit.  Call the clinic in the meantime if you develop and night sweats, unexplained weight loss, or unexplained fevers.    Thank you for choosing [place] at [facility] to provide your oncology and hematology care.  To afford each patient quality time with our provider, please arrive at least 15 minutes before your scheduled appointment time.   If you have a lab appointment with the [place] please come in thru the Main Entrance and check in at the main information desk.  You need to re-schedule your appointment should you arrive 10 or more minutes late.  We strive to give you quality time with our providers, and arriving late affects you and other patients whose appointments are after yours.  Also, if you no show three or more times for appointments you may be dismissed from the clinic at the providers discretion.     Again, thank you for choosing [facility].  Our hope is that these requests will decrease the amount of time that you wait before being seen by our physicians.       _____________________________________________________________  Should you have questions after your visit to [facility], please contact our office at [phone] and follow the prompts.  Our office hours are 8:00 a.m. and 4:30 p.m. Monday - Friday.  Please note that voicemails left after 4:00 p.m. may not be returned until the following business day.  We are closed weekends and major holidays.  You do have access to a nurse 24-7, just call the main number to the clinic [phone] and do not press any options, hold on the line and a nurse will answer the phone.    For prescription refill requests, have your pharmacy contact our office and allow  72 hours.    Due to Covid, you will need to wear a mask upon entering the hospital. If you do not have a mask, a mask will be given to you at the Main Entrance upon arrival. For doctor visits, patients may have 1 support person age 52 or older with them. For treatment visits, patients can not have anyone with them due to social distancing guidelines and our immunocompromised population.

## 2021-10-05 DIAGNOSIS — N1831 Chronic kidney disease, stage 3a: Secondary | ICD-10-CM | POA: Diagnosis not present

## 2021-10-05 DIAGNOSIS — I10 Essential (primary) hypertension: Secondary | ICD-10-CM | POA: Diagnosis not present

## 2021-11-04 DIAGNOSIS — G8190 Hemiplegia, unspecified affecting unspecified side: Secondary | ICD-10-CM | POA: Diagnosis not present

## 2021-11-04 DIAGNOSIS — I10 Essential (primary) hypertension: Secondary | ICD-10-CM | POA: Diagnosis not present

## 2021-11-21 DIAGNOSIS — N1832 Chronic kidney disease, stage 3b: Secondary | ICD-10-CM | POA: Diagnosis not present

## 2021-11-21 DIAGNOSIS — E6609 Other obesity due to excess calories: Secondary | ICD-10-CM | POA: Diagnosis not present

## 2021-11-21 DIAGNOSIS — D472 Monoclonal gammopathy: Secondary | ICD-10-CM | POA: Diagnosis not present

## 2021-11-21 DIAGNOSIS — I129 Hypertensive chronic kidney disease with stage 1 through stage 4 chronic kidney disease, or unspecified chronic kidney disease: Secondary | ICD-10-CM | POA: Diagnosis not present

## 2021-11-27 NOTE — Progress Notes (Signed)
CARDIOLOGY CONSULT NOTE       Patient ID: Kathleen Likins MRN: [id number] DOB/AGE: Dec 18, 1955 65 y.o.  Admit date: (Not on file) Referring Physician: [place] Primary Physician: Rosita Fire, MD Primary Cardiologist: New Reason for Consultation: Syncope  Active Problems:   * No active hospital problems. *   HPI:  65 y.o. referred by Dr Legrand Rams for chest pain. 06/06/21  Seen in [facility] ED 05/17/21 for syncope. History of stroke. Started on new BP meds Sitting on porch in heat with a beer talking with friends  Alcohol level elevated 45 in ER Not clear if he went out totally did not fall but rolled to right No palpitations , dyspnea, chest pain or seizure activity Labs remarkable for Cr 2.0. Likely from chronic poorly controlled HTN.  Felt better after hydration in ER Respiratory panel negative Troponin's no trend 66->61  CXR NAD Moved from [place] and noted seeing a Cardiologist there June 2022 with no abnormal findings   I reviewed records from [facility] and [facility]  Myovue 08/23/19 ? Inferior ischemia vs diaphragm attenuation EF 45% Stress echo 08/03/19 normal With no RWMAls US abdomen normal kidneys no AAA   Stroke 2 years ago ? From HTN.  Dysarthria and right sided hemiparesis Did good job with rehab And is very ambulatory and speaks much better now   He moved himself from [place] due to cost of living No family here. On disability and gets money From being in [employer]. Likes to restore cars   No angina, palpitations , dyspnea No recurrent syncope   He feels great with no worries. I mentioned his BP still seems a bit high and he indicated he had a few drinks last night. He does not want to adjust doses of his meds   ROS All other systems reviewed and negative except as noted above  Past Medical History:  Diagnosis Date   Stroke [facility])     No family history on file.  Social History   Socioeconomic History   Marital status: Single    Spouse  name: Not on file   Number of children: Not on file   Years of education: Not on file   Highest education level: Not on file  Occupational History   Not on file  Tobacco Use   Smoking status: Never   Smokeless tobacco: Never  Vaping Use   Vaping Use: Never used  Substance and Sexual Activity   Alcohol use: Yes   Drug use: Never   Sexual activity: Not on file  Other Topics Concern   Not on file  Social History Narrative   Not on file   Social Determinants of Health   Financial Resource Strain: Not on file  Food Insecurity: Not on file  Transportation Needs: Not on file  Physical Activity: Not on file  Stress: Not on file  Social Connections: Not on file  Intimate Partner Violence: Not on file    No past surgical history on file.    Current Outpatient Medications:    amLODipine (NORVASC) 10 MG tablet, Take 1 tablet by mouth daily., Disp: , Rfl:    aspirin 81 MG EC tablet, Take 81 mg by mouth daily., Disp: , Rfl:    atorvastatin (LIPITOR) 20 MG tablet, Take 1 tablet by mouth at bedtime., Disp: , Rfl:    losartan-hydrochlorothiazide (HYZAAR) 50-12.5 MG tablet, Take 1 tablet by mouth daily., Disp: , Rfl:     Physical Exam:  Ht  6\' 2"  (1.88 m)    Wt 240 lb (108.9 kg)    BMI 30.81 kg/m    Affect appropriate Healthy:  appears stated age 49: normal Neck supple with no adenopathy JVP normal no bruits no thyromegaly Lungs clear with no wheezing and good diaphragmatic motion Heart:  S1/S2 no murmur, no rub, gallop or click PMI normal Abdomen: benighn, BS positve, no tenderness, no AAA no bruit.  No HSM or HJR Distal pulses intact with no bruits No edema Neuro minimal right sided weakness and dysarthria  Skin warm and dry No muscular weakness   Labs:   Lab Results  Component Value Date   WBC 6.6 09/08/2021   HGB 14.0 09/08/2021   HCT 41.3 09/08/2021   MCV 94.5 09/08/2021   PLT 229 09/08/2021   No results for input(s): NA, K, CL, CO2, BUN, CREATININE,  CALCIUM, PROT, BILITOT, ALKPHOS, ALT, AST, GLUCOSE in the last 168 hours.  Invalid input(s): LABALBU No results found for: CKTOTAL, CKMB, CKMBINDEX, TROPONINI No results found for: CHOL No results found for: HDL No results found for: LDLCALC No results found for: TRIG No results found for: CHOLHDL No results found for: LDLDIRECT    Radiology: No results found.  EKG: SR rate 69 LVH with strain 05/18/21    ASSESSMENT AND PLAN:   Syncope: appears to have been in setting ETOH, 100 degree heat and dehydration with improvement iv fluid and BUN31 Cr 2.09.  He has had cardiac testing in [place] and is asymptomatic now will f/u in 6 months HTN:  signs of poor control with elevated Cr and strain on ECG Continue Hyzaar and norvasc started by primary He does not want to escalate dosing  HLD:  on statin f/u labs primary  CRF:  F/U [place] related to HTN  CR 1.45  CVA: no carotid bruit ? HTN great rehab with fairly normal strength RU/RL extremities Residual dysarthria But speech very understandable No carotid bruits  MGUS:  f/u Dr Delton Coombes IgG kappa M spike 1.6 g negative skeletal survey 09/08/21   Echo for syncope, LVH, stroke and ? DCM  F/U in a year   Signed: Jenkins Rouge 12/01/2021, 9:25 AM

## 2021-12-01 ENCOUNTER — Encounter: Payer: Self-pay | Admitting: Cardiovascular Disease

## 2021-12-01 ENCOUNTER — Ambulatory Visit: Payer: Medicare HMO | Admitting: Cardiovascular Disease

## 2021-12-01 ENCOUNTER — Other Ambulatory Visit: Payer: Self-pay

## 2021-12-01 VITALS — BP 160/80 | HR 92 | Ht 74.0 in | Wt 240.0 lb

## 2021-12-01 DIAGNOSIS — D472 Monoclonal gammopathy: Secondary | ICD-10-CM

## 2021-12-01 DIAGNOSIS — I10 Essential (primary) hypertension: Secondary | ICD-10-CM | POA: Diagnosis not present

## 2021-12-01 DIAGNOSIS — N182 Chronic kidney disease, stage 2 (mild): Secondary | ICD-10-CM | POA: Diagnosis not present

## 2021-12-01 DIAGNOSIS — R55 Syncope and collapse: Secondary | ICD-10-CM | POA: Diagnosis not present

## 2021-12-01 NOTE — Patient Instructions (Signed)
Medication Instructions:  Your physician recommends that you continue on your current medications as directed. Please refer to the Current Medication list given to you today.  *If you need a refill on your cardiac medications before your next appointment, please call your pharmacy*   Lab Work: NONE  If you have labs (blood work) drawn today and your tests are completely normal, you will receive your results only by: [place] (if you have MyChart) OR A paper copy in the mail If you have any lab test that is abnormal or we need to change your treatment, we will call you to review the results.   Testing/Procedures: Your physician has requested that you have an echocardiogram. Echocardiography is a painless test that uses sound waves to create images of your heart. It provides your doctor with information about the size and shape of your heart and how well your hearts chambers and valves are working. This procedure takes approximately one hour. There are no restrictions for this procedure.    Follow-Up: At [facility], you and your health needs are our priority.  As part of our continuing mission to provide you with exceptional heart care, we have created designated Provider Care Teams.  These Care Teams include your primary Cardiologist (physician) and Advanced Practice Providers (APPs -  Physician Assistants and Nurse Practitioners) who all work together to provide you with the care you need, when you need it.  We recommend signing up for the patient portal called "MyChart".  Sign up information is provided on this After Visit Summary.  MyChart is used to connect with patients for Virtual Visits (Telemedicine).  Patients are able to view lab/test results, encounter notes, upcoming appointments, etc.  Non-urgent messages can be sent to your provider as well.   To learn more about what you can do with MyChart, go to [email or web address].    Your next appointment:   1  year(s)  The format for your next appointment:   In Person  Provider:   Jenkins Rouge, MD    Other Instructions Thank you for choosing [facility]!

## 2021-12-11 DIAGNOSIS — I10 Essential (primary) hypertension: Secondary | ICD-10-CM | POA: Diagnosis not present

## 2021-12-11 DIAGNOSIS — Z1331 Encounter for screening for depression: Secondary | ICD-10-CM | POA: Diagnosis not present

## 2021-12-11 DIAGNOSIS — Z0001 Encounter for general adult medical examination with abnormal findings: Secondary | ICD-10-CM | POA: Diagnosis not present

## 2021-12-11 DIAGNOSIS — G8190 Hemiplegia, unspecified affecting unspecified side: Secondary | ICD-10-CM | POA: Diagnosis not present

## 2021-12-11 DIAGNOSIS — Z1389 Encounter for screening for other disorder: Secondary | ICD-10-CM | POA: Diagnosis not present

## 2021-12-11 DIAGNOSIS — N1831 Chronic kidney disease, stage 3a: Secondary | ICD-10-CM | POA: Diagnosis not present

## 2021-12-12 DIAGNOSIS — Z0000 Encounter for general adult medical examination without abnormal findings: Secondary | ICD-10-CM | POA: Diagnosis not present

## 2021-12-12 DIAGNOSIS — I10 Essential (primary) hypertension: Secondary | ICD-10-CM | POA: Diagnosis not present

## 2021-12-14 ENCOUNTER — Encounter: Payer: Self-pay | Admitting: *Deleted

## 2022-01-04 ENCOUNTER — Ambulatory Visit (HOSPITAL_COMMUNITY): Admission: RE | Admit: 2022-01-04 | Payer: Medicare HMO | Source: Ambulatory Visit

## 2022-01-11 DIAGNOSIS — I10 Essential (primary) hypertension: Secondary | ICD-10-CM | POA: Diagnosis not present

## 2022-01-11 DIAGNOSIS — G8190 Hemiplegia, unspecified affecting unspecified side: Secondary | ICD-10-CM | POA: Diagnosis not present

## 2022-01-26 ENCOUNTER — Inpatient Hospital Stay (HOSPITAL_COMMUNITY): Payer: Medicare PPO | Attending: Hematology

## 2022-01-30 ENCOUNTER — Ambulatory Visit: Payer: Medicare HMO

## 2022-02-02 ENCOUNTER — Ambulatory Visit (HOSPITAL_COMMUNITY): Payer: Medicare HMO | Admitting: Physician Assistant

## 2022-02-08 DIAGNOSIS — G8190 Hemiplegia, unspecified affecting unspecified side: Secondary | ICD-10-CM | POA: Diagnosis not present

## 2022-02-08 DIAGNOSIS — I10 Essential (primary) hypertension: Secondary | ICD-10-CM | POA: Diagnosis not present

## 2022-02-12 ENCOUNTER — Other Ambulatory Visit: Payer: Self-pay

## 2022-02-12 ENCOUNTER — Ambulatory Visit (INDEPENDENT_AMBULATORY_CARE_PROVIDER_SITE_OTHER): Payer: Self-pay | Admitting: *Deleted

## 2022-02-12 VITALS — Ht 74.0 in | Wt 147.0 lb

## 2022-02-12 DIAGNOSIS — Z1211 Encounter for screening for malignant neoplasm of colon: Secondary | ICD-10-CM

## 2022-02-12 NOTE — Progress Notes (Signed)
Needs office visit to arrange procedure.  History of CVA. ?

## 2022-02-12 NOTE — Progress Notes (Signed)
Gastroenterology Pre-Procedure Review ? ?Request Date: 02/12/2022 ?Requesting Physician: Dr. Legrand Rams, no previous TCS ? ?PATIENT REVIEW QUESTIONS: The patient responded to the following health history questions as indicated:   ? ?1. Diabetes Melitis: no ?2. Joint replacements in the past 12 months: no ?3. Major health problems in the past 3 months: no ?4. Has an artificial valve or MVP: no ?5. Has a defibrillator: no ?6. Has been advised in past to take antibiotics in advance of a procedure like teeth cleaning: no ?7. Family history of colon cancer: no  ?8. Alcohol Use: yes, 3 beers a week ?9. Illicit drug Use: no ?10. History of sleep apnea: no  ?11. History of coronary artery or other vascular stents placed within the last 12 months: no ?12. History of any prior anesthesia complications: no ?13. Body mass index is 18.87 kg/m?. ?   ?MEDICATIONS & ALLERGIES:    ?Patient reports the following regarding taking any blood thinners:   ?Plavix? no ?Aspirin? Yes, 81 mg daily ?Coumadin? no ?Brilinta? no ?Xarelto? no ?Eliquis? no ?Pradaxa? no ?Savaysa? no ?Effient? no ? ?Patient confirms/reports the following medications:  ?Current Outpatient Medications  ?Medication Sig Dispense Refill  ? amLODipine (NORVASC) 10 MG tablet Take 1 tablet by mouth daily.    ? aspirin 81 MG EC tablet Take 81 mg by mouth daily.    ? atorvastatin (LIPITOR) 20 MG tablet Take 1 tablet by mouth at bedtime.    ? losartan-hydrochlorothiazide (HYZAAR) 50-12.5 MG tablet Take 1 tablet by mouth daily.    ? ?No current facility-administered medications for this visit.  ? ? ?Patient confirms/reports the following allergies:  ?No Known Allergies ? ?No orders of the defined types were placed in this encounter. ? ? ?AUTHORIZATION INFORMATION ?Primary Insurance: [place],  [place] #: [id number],  Group #: [id number] ?Pre-Cert / Josem Kaufmann required:  ?Pre-Cert / Auth #:  ? ?SCHEDULE INFORMATION: ?Procedure has been scheduled as follows:  ?Date: , Time:   ?Location: [facility]  with Dr. Abbey Chatters ? ?This Gastroenterology Pre-Precedure Review Form is being routed to the following provider(s): Aliene Altes, PA-C ?  ?

## 2022-02-14 NOTE — Progress Notes (Signed)
Tried to call pt.  Voice mail full. ?

## 2022-02-15 NOTE — Progress Notes (Signed)
Spoke to pt.  He was made aware that he needs ov due to hx of CVA.  Scheduled ov for 02/19/2022 at 9:30 with Neil Crouch, PA-C. ?

## 2022-02-19 ENCOUNTER — Other Ambulatory Visit: Payer: Self-pay

## 2022-02-19 ENCOUNTER — Ambulatory Visit (INDEPENDENT_AMBULATORY_CARE_PROVIDER_SITE_OTHER): Payer: Medicare HMO | Admitting: Gastroenterology

## 2022-02-19 ENCOUNTER — Encounter: Payer: Self-pay | Admitting: Gastroenterology

## 2022-02-19 ENCOUNTER — Telehealth: Payer: Self-pay

## 2022-02-19 DIAGNOSIS — Z1211 Encounter for screening for malignant neoplasm of colon: Secondary | ICD-10-CM

## 2022-02-19 MED ORDER — PEG 3350-KCL-NA BICARB-NACL 420 G PO SOLR: 4000.0000 mL | ORAL | 0 refills | Status: DC

## 2022-02-19 NOTE — H&P (View-Only) (Signed)
? ? ? ?GI Office Note   ? ?Referring Provider: Carrolyn Meiers* ?Primary Care Physician:  Carrolyn Meiers, MD  ?Primary Gastroenterologist: ? ?Chief Complaint  ? ?Chief Complaint  ?Patient presents with  ? Colonoscopy  ? ?History of Present Illness  ? ?Lee Robinson is a 66 y.o. male presenting today at the request of Dr. Legrand Rams for consideration of colonoscopy.  Due to history of stroke in 05/2019 (right sided deficits/slurred speech), patient was advised to come in for an office visit.  He has a history of stage IIIb chronic kidney disease followed by Dr. Theador Hawthorne.  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 H/O of CVA. H/O HTN, HLD. He has seen cardiology, Dr. Johnsie Cancel, back in December for follow up.  History of ?syncope seen in ED (summer 2022) thought to be related to ETOH, 100 degree heat, new BP med, and dehydration. Prior cardiac testing in [place], per patient unremarkable including cardiac cath, "states he had everything done". See 06/2021 note for prior work up, recorded by Dr. Johnsie Cancel. ECHO ordered by Dr. Johnsie Cancel but patient no showed, patient said he forgot.  ? ?Patient has never had a colonoscopy.  Denies family history of colon cancer. Denies bowel issues. No melena, brbpr. No heartburn. No abdominal pain. No dysphagia. No unintentional weight loss. After CVA got down to 207 but now up to 253 pounds. Does all of his own cooking. No chest pain, SOB. ? ?Moved to  8 months ago, due to living expenses. Has come along way since his CVA. 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 Having his house fixed up/fixer upper.  ? ?Medications  ? ?Current Outpatient Medications  ?Medication Sig Dispense Refill  ? amLODipine (NORVASC) 10 MG tablet Take 1 tablet by mouth daily.    ? aspirin 81 MG EC tablet Take 81 mg by mouth daily.    ? atorvastatin (LIPITOR) 20 MG tablet Take 1 tablet by mouth at bedtime.    ?  losartan-hydrochlorothiazide (HYZAAR) 50-12.5 MG tablet Take 1 tablet by mouth daily.    ? ?No current facility-administered medications for this visit.  ? ? ?Allergies  ? ?Allergies as of 02/19/2022  ? (No Known Allergies)  ? ? ?Past Medical History  ? ?Past Medical History:  ?Diagnosis Date  ? HTN (hypertension)   ? Hyperlipidemia   ? Stroke [facility])   ? ? ?Past Surgical History  ? ?Past Surgical History:  ?Procedure Laterality Date  ? none    ? ? ?Past Family History  ? ?Family History  ?Adopted: Yes  ?Problem Relation Age of Onset  ? Stroke Mother   ?     deceased secondary cva  ? Colon cancer Neg Hx   ? ? ?Past Social History  ? ?Social History  ? ?Socioeconomic History  ? Marital status: Single  ?  Spouse name: Not on file  ? Number of children: Not on file  ? Years of education: Not on file  ? Highest education level: Not on file  ?Occupational History  ? Not on file  ?Tobacco Use  ? Smoking status: Never  ? Smokeless tobacco: Never  ?Vaping Use  ? Vaping Use: Never used  ?Substance and Sexual Activity  ? Alcohol use: Yes  ?  Comment: 2 to 3 beers a week  ? Drug use: Never  ? Sexual activity: Not Currently  ?Other Topics Concern  ? Not on file  ?  Social History Narrative  ? Used to be in the [employer].   ? Owned on catering business for 18 years.   ? ?Social Determinants of Health  ? ?Financial Resource Strain: Not on file  ?Food Insecurity: Not on file  ?Transportation Needs: Not on file  ?Physical Activity: Not on file  ?Stress: Not on file  ?Social Connections: Not on file  ?Intimate Partner Violence: Not on file  ? ? ?Review of Systems  ? ?General: Negative for anorexia, weight loss, fever, chills, fatigue, weakness. ?Eyes: Negative for vision changes.  ?ENT: Negative for hoarseness, difficulty swallowing , nasal congestion. ?CV: Negative for chest pain, angina, palpitations, dyspnea on exertion, peripheral edema.  ?Respiratory: Negative for dyspnea at rest, dyspnea on exertion, cough, sputum, wheezing.  ?GI: See  history of present illness. ?GU:  Negative for dysuria, hematuria, urinary incontinence, urinary frequency, nocturnal urination.  ?MS: Negative for joint pain, low back pain.  ?Derm: Negative for rash or itching.  ?Neuro: Negative for seizure, frequent headaches, memory loss, confusion. Chronic right sided weakness, speech issues.   ?Psych: Negative for anxiety, depression, suicidal ideation, hallucinations.  ?Endo: Negative for unusual weight change.  ?Heme: Negative for bruising or bleeding. ?Allergy: Negative for rash or hives. ? ?Physical Exam  ? ?BP 130/80 (BP Location: Right Arm, Patient Position: Sitting, Cuff Size: Large)   Pulse 88   Temp 97.8 ?F (36.6 ?C) (Temporal)   Ht '6\' 2"'$  (1.88 m)   Wt 253 lb 9.6 oz (115 kg)   SpO2 98%   BMI 32.56 kg/m?  ?  ?General: Well-nourished, well-developed in no acute distress. Speech little slurred, disconnected at times but comprehendible.  ?Head: Normocephalic, atraumatic.   ?Eyes: Conjunctiva pink, no icterus. ?Mouth: masked ?Neck: Supple without thyromegaly, masses, or lymphadenopathy.  ?Lungs: Clear to auscultation bilaterally.  ?Heart: Regular rate and rhythm, no murmurs rubs or gallops.  ?Abdomen: Bowel sounds are normal, nontender, nondistended, no hepatosplenomegaly or masses,  ?no abdominal bruits or hernia, no rebound or guarding.   ?Rectal: not performed ?Extremities: No lower extremity edema. No clubbing or deformities.  ?Neuro: Alert and oriented x 4 , grossly normal neurologically.  ?Skin: Warm and dry, no rash or jaundice.   ?Psych: Alert and cooperative, normal mood and affect. ? ?Labs  ? ?Lab Results  ?Component Value Date  ? CREATININE 1.70 (H) 09/08/2021  ? BUN 28 (H) 09/08/2021  ? NA 135 09/08/2021  ? K 3.6 09/08/2021  ? CL 100 09/08/2021  ? CO2 28 09/08/2021  ? ?Lab Results  ?Component Value Date  ? ALT 22 09/08/2021  ? AST 25 09/08/2021  ? ALKPHOS 79 09/08/2021  ? BILITOT 1.7 (H) 09/08/2021  ? ?Lab Results  ?Component Value Date  ? WBC 6.6  09/08/2021  ? HGB 14.0 09/08/2021  ? HCT 41.3 09/08/2021  ? MCV 94.5 09/08/2021  ? PLT 229 09/08/2021  ? ?No results found for: HGBA1C ?No results found for: TSH ?No results found for: INR, PROTIME ? ?Imaging Studies  ? ?No results found. ? ?Assessment  ? ?Lee Robinson is a 66 y.o. male presenting today to schedule first ever colonoscopy. No FH of CRC. No GI concerns. He has had CVA in 2020 with residual speech issues and some right upper extremity weakness. He is independent, lives alone, drives. He reports unremarkable cardiac work up in [place] including Myovue, stress echo (as outlined in 06/07/2021 OV note by Dr. Johnsie Cancel). He desires colonoscopy at this time.  ? ? ?PLAN  ? ?Colonoscopy with  Dr. Abbey Chatters. ASA 3.  I have discussed the risks, alternatives, benefits with regards to but not limited to the risk of reaction to medication, bleeding, infection, perforation and the patient is agreeable to proceed. Written consent to be obtained. ? ? ?Laureen Ochs. Deronda Christian, MHS, PA-C ?[facility] ? ?

## 2022-02-19 NOTE — Telephone Encounter (Signed)
PA for TCS submitted via [employer] website. Auth# [id number], valid 03/19/22-06/17/22. ?

## 2022-02-19 NOTE — Progress Notes (Signed)
? ? ? ?GI Office Note   ? ?Referring Provider: Carrolyn Meiers* ?Primary Care Physician:  Carrolyn Meiers, MD  ?Primary Gastroenterologist: ? ?Chief Complaint  ? ?Chief Complaint  ?Patient presents with  ? Colonoscopy  ? ?History of Present Illness  ? ?Lee Robinson is a 66 y.o. male presenting today at the request of Dr. Legrand Rams for consideration of colonoscopy.  Due to history of stroke in 05/2019 (right sided deficits/slurred speech), patient was advised to come in for an office visit.  He has a history of stage IIIb chronic kidney disease followed by Dr. Theador Hawthorne.  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 H/O of CVA. H/O HTN, HLD. He has seen cardiology, Dr. Johnsie Cancel, back in December for follow up.  History of ?syncope seen in ED (summer 2022) thought to be related to ETOH, 100 degree heat, new BP med, and dehydration. Prior cardiac testing in [place], per patient unremarkable including cardiac cath, "states he had everything done". See 06/2021 note for prior work up, recorded by Dr. Johnsie Cancel. ECHO ordered by Dr. Johnsie Cancel but patient no showed, patient said he forgot.  ? ?Patient has never had a colonoscopy.  Denies family history of colon cancer. Denies bowel issues. No melena, brbpr. No heartburn. No abdominal pain. No dysphagia. No unintentional weight loss. After CVA got down to 207 but now up to 253 pounds. Does all of his own cooking. No chest pain, SOB. ? ?Moved to [place] 8 months ago, due to living expenses. Has come along way since his CVA. 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 Having his house fixed up/fixer upper.  ? ?Medications  ? ?Current Outpatient Medications  ?Medication Sig Dispense Refill  ? amLODipine (NORVASC) 10 MG tablet Take 1 tablet by mouth daily.    ? aspirin 81 MG EC tablet Take 81 mg by mouth daily.    ? atorvastatin (LIPITOR) 20 MG tablet Take 1 tablet by mouth at bedtime.    ?  losartan-hydrochlorothiazide (HYZAAR) 50-12.5 MG tablet Take 1 tablet by mouth daily.    ? ?No current facility-administered medications for this visit.  ? ? ?Allergies  ? ?Allergies as of 02/19/2022  ? (No Known Allergies)  ? ? ?Past Medical History  ? ?Past Medical History:  ?Diagnosis Date  ? HTN (hypertension)   ? Hyperlipidemia   ? Stroke [facility])   ? ? ?Past Surgical History  ? ?Past Surgical History:  ?Procedure Laterality Date  ? none    ? ? ?Past Family History  ? ?Family History  ?Adopted: Yes  ?Problem Relation Age of Onset  ? Stroke Mother   ?     deceased secondary cva  ? Colon cancer Neg Hx   ? ? ?Past Social History  ? ?Social History  ? ?Socioeconomic History  ? Marital status: Single  ?  Spouse name: Not on file  ? Number of children: Not on file  ? Years of education: Not on file  ? Highest education level: Not on file  ?Occupational History  ? Not on file  ?Tobacco Use  ? Smoking status: Never  ? Smokeless tobacco: Never  ?Vaping Use  ? Vaping Use: Never used  ?Substance and Sexual Activity  ? Alcohol use: Yes  ?  Comment: 2 to 3 beers a week  ? Drug use: Never  ? Sexual activity: Not Currently  ?Other Topics Concern  ? Not on file  ?  Social History Narrative  ? Used to be in the [place].   ? Owned on catering business for 18 years.   ? ?Social Determinants of Health  ? ?Financial Resource Strain: Not on file  ?Food Insecurity: Not on file  ?Transportation Needs: Not on file  ?Physical Activity: Not on file  ?Stress: Not on file  ?Social Connections: Not on file  ?Intimate Partner Violence: Not on file  ? ? ?Review of Systems  ? ?General: Negative for anorexia, weight loss, fever, chills, fatigue, weakness. ?Eyes: Negative for vision changes.  ?ENT: Negative for hoarseness, difficulty swallowing , nasal congestion. ?CV: Negative for chest pain, angina, palpitations, dyspnea on exertion, peripheral edema.  ?Respiratory: Negative for dyspnea at rest, dyspnea on exertion, cough, sputum, wheezing.  ?GI: See  history of present illness. ?GU:  Negative for dysuria, hematuria, urinary incontinence, urinary frequency, nocturnal urination.  ?MS: Negative for joint pain, low back pain.  ?Derm: Negative for rash or itching.  ?Neuro: Negative for seizure, frequent headaches, memory loss, confusion. Chronic right sided weakness, speech issues.   ?Psych: Negative for anxiety, depression, suicidal ideation, hallucinations.  ?Endo: Negative for unusual weight change.  ?Heme: Negative for bruising or bleeding. ?Allergy: Negative for rash or hives. ? ?Physical Exam  ? ?BP 130/80 (BP Location: Right Arm, Patient Position: Sitting, Cuff Size: Large)   Pulse 88   Temp 97.8 ?F (36.6 ?C) (Temporal)   Ht '6\' 2"'$  (1.88 m)   Wt 253 lb 9.6 oz (115 kg)   SpO2 98%   BMI 32.56 kg/m?  ?  ?General: Well-nourished, well-developed in no acute distress. Speech little slurred, disconnected at times but comprehendible.  ?Head: Normocephalic, atraumatic.   ?Eyes: Conjunctiva pink, no icterus. ?Mouth: masked ?Neck: Supple without thyromegaly, masses, or lymphadenopathy.  ?Lungs: Clear to auscultation bilaterally.  ?Heart: Regular rate and rhythm, no murmurs rubs or gallops.  ?Abdomen: Bowel sounds are normal, nontender, nondistended, no hepatosplenomegaly or masses,  ?no abdominal bruits or hernia, no rebound or guarding.   ?Rectal: not performed ?Extremities: No lower extremity edema. No clubbing or deformities.  ?Neuro: Alert and oriented x 4 , grossly normal neurologically.  ?Skin: Warm and dry, no rash or jaundice.   ?Psych: Alert and cooperative, normal mood and affect. ? ?Labs  ? ?Lab Results  ?Component Value Date  ? CREATININE 1.70 (H) 09/08/2021  ? BUN 28 (H) 09/08/2021  ? NA 135 09/08/2021  ? K 3.6 09/08/2021  ? CL 100 09/08/2021  ? CO2 28 09/08/2021  ? ?Lab Results  ?Component Value Date  ? ALT 22 09/08/2021  ? AST 25 09/08/2021  ? ALKPHOS 79 09/08/2021  ? BILITOT 1.7 (H) 09/08/2021  ? ?Lab Results  ?Component Value Date  ? WBC 6.6  09/08/2021  ? HGB 14.0 09/08/2021  ? HCT 41.3 09/08/2021  ? MCV 94.5 09/08/2021  ? PLT 229 09/08/2021  ? ?No results found for: HGBA1C ?No results found for: TSH ?No results found for: INR, PROTIME ? ?Imaging Studies  ? ?No results found. ? ?Assessment  ? ?Jaidyn Kuhl is a 66 y.o. male presenting today to schedule first ever colonoscopy. No FH of CRC. No GI concerns. He has had CVA in 2020 with residual speech issues and some right upper extremity weakness. He is independent, lives alone, drives. He reports unremarkable cardiac work up in [place] including Myovue, stress echo (as outlined in 06/07/2021 OV note by Dr. Johnsie Cancel). He desires colonoscopy at this time.  ? ? ?PLAN  ? ?Colonoscopy with  Dr. Abbey Chatters. ASA 3.  I have discussed the risks, alternatives, benefits with regards to but not limited to the risk of reaction to medication, bleeding, infection, perforation and the patient is agreeable to proceed. Written consent to be obtained. ? ? ?Laureen Ochs. Hibah Odonnell, MHS, PA-C ?[facility] ? ?

## 2022-02-19 NOTE — Patient Instructions (Signed)
Colonoscopy to be scheduled. See separate instructions.  ?

## 2022-03-11 DIAGNOSIS — I10 Essential (primary) hypertension: Secondary | ICD-10-CM | POA: Diagnosis not present

## 2022-03-11 DIAGNOSIS — N1831 Chronic kidney disease, stage 3a: Secondary | ICD-10-CM | POA: Diagnosis not present

## 2022-03-12 NOTE — Patient Instructions (Signed)
? ? ? ? ? ? Lee Robinson ? 03/12/2022  ?  ? '@PREFPERIOPPHARMACY'$ @ ? ? Your procedure is scheduled on  03/19/2022. ? ? Report to [facility] at  0700  A.M. ? ? Call this number if you have problems the morning of surgery: ? [phone] ? ? Remember: ? Follow the diet and prep instructions given to you by the office. ?  ? Take these medicines the morning of surgery with A SIP OF WATER  ? ?amlodipine. ? ?  ? Do not wear jewelry, make-up or nail polish. ? Do not wear lotions, powders, or perfumes, or deodorant. ? Do not shave 48 hours prior to surgery.  Men may shave face and neck. ? Do not bring valuables to the hospital. ? [facility] is not responsible for any belongings or valuables. ? ?Contacts, dentures or bridgework may not be worn into surgery.  Leave your suitcase in the car.  After surgery it may be brought to your room. ? ?For patients admitted to the hospital, discharge time will be determined by your treatment team. ? ?Patients discharged the day of surgery will not be allowed to drive home and must have someone with them for 24 hours.  ? ? ?Special instructions:   DO NOT smoke tobacco or vape for 24 hours before your procedure. ? ?Please read over the following fact sheets that you were given. ?Anesthesia Post-op Instructions and Care and Recovery After Surgery ?  ? ? ? Colonoscopy, Adult, Care After ?This sheet gives you information about how to care for yourself after your procedure. Your health care provider may also give you more specific instructions. If you have problems or questions, contact your health care provider. ?What can I expect after the procedure? ?After the procedure, it is common to have: ?A small amount of blood in your stool for 24 hours after the procedure. ?Some gas. ?Mild cramping or bloating of your abdomen. ?Follow these instructions at home: ?Eating and drinking ? ?Drink enough fluid to keep your urine pale yellow. ?Follow instructions from your health care provider about eating or  drinking restrictions. ?Resume your normal diet as instructed by your health care provider. Avoid heavy or fried foods that are hard to digest. ?Activity ?Rest as told by your health care provider. ?Avoid sitting for a long time without moving. Get up to take short walks every 1-2 hours. This is important to improve blood flow and breathing. Ask for help if you feel weak or unsteady. ?Return to your normal activities as told by your health care provider. Ask your health care provider what activities are safe for you. ?Managing cramping and bloating ? ?Try walking around when you have cramps or feel bloated. ?Apply heat to your abdomen as told by your health care provider. Use the heat source that your health care provider recommends, such as a moist heat pack or a heating pad. ?Place a towel between your skin and the heat source. ?Leave the heat on for 20-30 minutes. ?Remove the heat if your skin turns bright red. This is especially important if you are unable to feel pain, heat, or cold. You may have a greater risk of getting burned. ?General instructions ?If you were given a sedative during the procedure, it can affect you for several hours. Do not drive or operate machinery until your health care provider says that it is safe. ?For the first 24 hours after the procedure: ?Do not sign important documents. ?Do not drink alcohol. ?Do your regular  daily activities at a slower pace than normal. ?Eat soft foods that are easy to digest. ?Take over-the-counter and prescription medicines only as told by your health care provider. ?Keep all follow-up visits as told by your health care provider. This is important. ?Contact a health care provider if: ?You have blood in your stool 2-3 days after the procedure. ?Get help right away if you have: ?More than a small spotting of blood in your stool. ?Large blood clots in your stool. ?Swelling of your abdomen. ?Nausea or vomiting. ?A fever. ?Increasing pain in your abdomen that is  not relieved with medicine. ?Summary ?After the procedure, it is common to have a small amount of blood in your stool. You may also have mild cramping and bloating of your abdomen. ?If you were given a sedative during the procedure, it can affect you for several hours. Do not drive or operate machinery until your health care provider says that it is safe. ?Get help right away if you have a lot of blood in your stool, nausea or vomiting, a fever, or increased pain in your abdomen. ?This information is not intended to replace advice given to you by your health care provider. Make sure you discuss any questions you have with your health care provider. ?Document Revised: 09/25/2019 Document Reviewed: 06/15/2019 ?Elsevier Patient Education ? [place]. ?Monitored Anesthesia Care, Care After ?This sheet gives you information about how to care for yourself after your procedure. Your health care provider may also give you more specific instructions. If you have problems or questions, contact your health care provider. ?What can I expect after the procedure? ?After the procedure, it is common to have: ?Tiredness. ?Forgetfulness about what happened after the procedure. ?Impaired judgment for important decisions. ?Nausea or vomiting. ?Some difficulty with balance. ?Follow these instructions at home: ?For the time period you were told by your health care provider: ?  ?Rest as needed. ?Do not participate in activities where you could fall or become injured. ?Do not drive or use machinery. ?Do not drink alcohol. ?Do not take sleeping pills or medicines that cause drowsiness. ?Do not make important decisions or sign legal documents. ?Do not take care of children on your own. ?Eating and drinking ?Follow the diet that is recommended by your health care provider. ?Drink enough fluid to keep your urine pale yellow. ?If you vomit: ?Drink water, juice, or soup when you can drink without vomiting. ?Make sure you have little or  no nausea before eating solid foods. ?General instructions ?Have a responsible adult stay with you for the time you are told. It is important to have someone help care for you until you are awake and alert. ?Take over-the-counter and prescription medicines only as told by your health care provider. ?If you have sleep apnea, surgery and certain medicines can increase your risk for breathing problems. Follow instructions from your health care provider about wearing your sleep device: ?Anytime you are sleeping, including during daytime naps. ?While taking prescription pain medicines, sleeping medicines, or medicines that make you drowsy. ?Avoid smoking. ?Keep all follow-up visits as told by your health care provider. This is important. ?Contact a health care provider if: ?You keep feeling nauseous or you keep vomiting. ?You feel light-headed. ?You are still sleepy or having trouble with balance after 24 hours. ?You develop a rash. ?You have a fever. ?You have redness or swelling around the IV site. ?Get help right away if: ?You have trouble breathing. ?You have new-onset confusion at home. ?Summary ?  For several hours after your procedure, you may feel tired. You may also be forgetful and have poor judgment. ?Have a responsible adult stay with you for the time you are told. It is important to have someone help care for you until you are awake and alert. ?Rest as told. Do not drive or operate machinery. Do not drink alcohol or take sleeping pills. ?Get help right away if you have trouble breathing, or if you suddenly become confused. ?This information is not intended to replace advice given to you by your health care provider. Make sure you discuss any questions you have with your health care provider. ?Document Revised: 08/04/2020 Document Reviewed: 10/22/2019 ?Elsevier Patient Education ? [place]. ? ?

## 2022-03-15 ENCOUNTER — Encounter (HOSPITAL_COMMUNITY)
Admission: RE | Admit: 2022-03-15 | Discharge: 2022-03-15 | Disposition: A | Discharge status: Home / Self Care | Payer: Medicare HMO | Source: Ambulatory Visit | Attending: Internal Medicine | Admitting: Internal Medicine

## 2022-03-19 ENCOUNTER — Ambulatory Visit (HOSPITAL_BASED_OUTPATIENT_CLINIC_OR_DEPARTMENT_OTHER): Payer: Medicare HMO | Admitting: Anesthesiology

## 2022-03-19 ENCOUNTER — Ambulatory Visit (HOSPITAL_COMMUNITY)
Admission: RE | Admit: 2022-03-19 | Discharge: 2022-03-19 | Disposition: A | Discharge status: Home / Self Care | Payer: Medicare HMO | Source: Ambulatory Visit | Attending: Internal Medicine | Admitting: Internal Medicine

## 2022-03-19 ENCOUNTER — Telehealth: Payer: Self-pay | Admitting: Internal Medicine

## 2022-03-19 ENCOUNTER — Encounter (HOSPITAL_COMMUNITY): Payer: Self-pay

## 2022-03-19 ENCOUNTER — Encounter (HOSPITAL_COMMUNITY)
Admission: RE | Disposition: A | Discharge status: Home / Self Care | Payer: Self-pay | Source: Ambulatory Visit | Attending: Internal Medicine

## 2022-03-19 ENCOUNTER — Ambulatory Visit (HOSPITAL_COMMUNITY): Payer: Medicare HMO | Admitting: Anesthesiology

## 2022-03-19 DIAGNOSIS — N1832 Chronic kidney disease, stage 3b: Secondary | ICD-10-CM | POA: Insufficient documentation

## 2022-03-19 DIAGNOSIS — Z1211 Encounter for screening for malignant neoplasm of colon: Secondary | ICD-10-CM | POA: Insufficient documentation

## 2022-03-19 DIAGNOSIS — I129 Hypertensive chronic kidney disease with stage 1 through stage 4 chronic kidney disease, or unspecified chronic kidney disease: Secondary | ICD-10-CM | POA: Insufficient documentation

## 2022-03-19 DIAGNOSIS — I69322 Dysarthria following cerebral infarction: Secondary | ICD-10-CM | POA: Insufficient documentation

## 2022-03-19 DIAGNOSIS — E785 Hyperlipidemia, unspecified: Secondary | ICD-10-CM | POA: Insufficient documentation

## 2022-03-19 DIAGNOSIS — K648 Other hemorrhoids: Secondary | ICD-10-CM

## 2022-03-19 DIAGNOSIS — K5730 Diverticulosis of large intestine without perforation or abscess without bleeding: Secondary | ICD-10-CM | POA: Diagnosis not present

## 2022-03-19 DIAGNOSIS — I10 Essential (primary) hypertension: Secondary | ICD-10-CM | POA: Diagnosis not present

## 2022-03-19 DIAGNOSIS — Z8673 Personal history of transient ischemic attack (TIA), and cerebral infarction without residual deficits: Secondary | ICD-10-CM | POA: Diagnosis not present

## 2022-03-19 HISTORY — PX: COLONOSCOPY WITH PROPOFOL: SHX5780

## 2022-03-19 SURGERY — COLONOSCOPY WITH PROPOFOL
Anesthesia: General

## 2022-03-19 MED ORDER — LACTATED RINGERS IV SOLN
INTRAVENOUS | Status: DC
  Administered 2022-03-19: 1000 mL via INTRAVENOUS

## 2022-03-19 MED ORDER — PROPOFOL 10 MG/ML IV BOLUS
INTRAVENOUS | Status: DC | PRN
  Administered 2022-03-19 (×2): 50 mg via INTRAVENOUS
  Administered 2022-03-19: 100 mg via INTRAVENOUS
  Administered 2022-03-19: 50 mg via INTRAVENOUS

## 2022-03-19 MED ORDER — LIDOCAINE HCL (CARDIAC) PF 100 MG/5ML IV SOSY
PREFILLED_SYRINGE | INTRAVENOUS | Status: DC | PRN
  Administered 2022-03-19: 50 mg via INTRAVENOUS

## 2022-03-19 NOTE — Anesthesia Procedure Notes (Signed)
Date/Time: 03/19/2022 11:38 AM ?Performed by: Karna Dupes, CRNA ?Pre-anesthesia Checklist: Patient being monitored, Suction available, Emergency Drugs available and Patient identified ?Patient Re-evaluated:Patient Re-evaluated prior to induction ?Oxygen Delivery Method: Nasal cannula ? ? ? ? ?

## 2022-03-19 NOTE — Anesthesia Postprocedure Evaluation (Signed)
Anesthesia Post Note ? ?Patient: Lee Robinson ? ?Procedure(s) Performed: COLONOSCOPY WITH PROPOFOL ? ?Patient location during evaluation: Phase II ?Anesthesia Type: General ?Level of consciousness: awake ?Pain management: pain level controlled ?Vital Signs Assessment: post-procedure vital signs reviewed and stable ?Respiratory status: spontaneous breathing and respiratory function stable ?Cardiovascular status: blood pressure returned to baseline and stable ?Postop Assessment: no headache and no apparent nausea or vomiting ?Anesthetic complications: no ?Comments: Late entry ? ? ?No notable events documented. ? ? ?Last Vitals:  ?Vitals:  ? 03/19/22 0948 03/19/22 1157  ?BP: (!) 181/88 123/67  ?Pulse: 76   ?Resp: 19 11  ?Temp: 36.8 ?C 36.4 ?C  ?SpO2: 99% 94%  ?  ?Last Pain:  ?Vitals:  ? 03/19/22 1203  ?TempSrc:   ?PainSc: 0-No pain  ? ? ?  ?  ?  ?  ?  ?  ? ?Louann Sjogren ? ? ? ? ?

## 2022-03-19 NOTE — OR Nursing (Signed)
Patient was to arrive this morning for procedure ( Colonoscopy) at 0700.  No show. Dr. Abbey Chatters aware and office notified . Spoke with Manuela Schwartz. ?  ?

## 2022-03-19 NOTE — Transfer of Care (Signed)
Immediate Anesthesia Transfer of Care Note ? ?Patient: Lee Robinson ? ?Procedure(s) Performed: COLONOSCOPY WITH PROPOFOL ? ?Patient Location: Short Stay ? ?Anesthesia Type:General ? ?Level of Consciousness: drowsy ? ?Airway & Oxygen Therapy: Patient Spontanous Breathing ? ?Post-op Assessment: Report given to RN and Post -op Vital signs reviewed and stable ? ?Post vital signs: Reviewed and stable ? ?Last Vitals:  ?Vitals Value Taken Time  ?BP    ?Temp    ?Pulse    ?Resp    ?SpO2    ? ? ?Last Pain:  ?Vitals:  ? 03/19/22 0948  ?TempSrc: Oral  ?PainSc: 0-No pain  ?   ? ?Patients Stated Pain Goal: 8 (03/19/22 0948) ? ?Complications: No notable events documented. ?

## 2022-03-19 NOTE — Op Note (Signed)
[facility] ?Patient Name: Lee Robinson ?Procedure Date: 03/19/2022 11:23 AM ?MRN: [id number] ?Date of Birth: 07/05/1956 ?Attending MD: Elon Alas. Abbey Chatters , DO ?CSN: [id number] ?Age: 66 ?Admit Type: Outpatient ?Procedure:                Colonoscopy ?Indications:              Screening for colorectal malignant neoplasm ?Providers:                Elon Alas. Abbey Chatters, DO, Janeece Riggers, RN, Janett Billow  ?                          Boudreaux, Randa Spike, Technician, [employer],  ?                          Technician ?Referring MD:              ?Medicines:                See the Anesthesia note for documentation of the  ?                          administered medications ?Complications:            No immediate complications. ?Estimated Blood Loss:     Estimated blood loss: none. ?Procedure:                Pre-Anesthesia Assessment: ?                          - The anesthesia plan was to use monitored  ?                          anesthesia care (MAC). ?                          After obtaining informed consent, the colonoscope  ?                          was passed under direct vision. Throughout the  ?                          procedure, the patient's blood pressure, pulse, and  ?                          oxygen saturations were monitored continuously. The  ?                          PCF-HQ190L ([id number]) scope was introduced through  ?                          the anus and advanced to the the terminal ileum,  ?                          with identification of the appendiceal orifice and  ?                          IC valve. The colonoscopy was performed without  ?  difficulty. The patient tolerated the procedure  ?                          well. The quality of the bowel preparation was  ?                          evaluated using the BBPS [facility] Bowel Preparation  ?                          Scale) with scores of: Right Colon = 2 (minor  ?                          amount of residual staining, small  fragments of  ?                          stool and/or opaque liquid, but mucosa seen well),  ?                          Transverse Colon = 3 (entire mucosa seen well with  ?                          no residual staining, small fragments of stool or  ?                          opaque liquid) and Left Colon = 3 (entire mucosa  ?                          seen well with no residual staining, small  ?                          fragments of stool or opaque liquid). The total  ?                          BBPS score equals 8. The quality of the bowel  ?                          preparation was good. ?Scope In: 11:41:54 AM ?Scope Out: 11:54:13 AM ?Scope Withdrawal Time: 0 hours 10 minutes 28 seconds  ?Total Procedure Duration: 0 hours 12 minutes 19 seconds  ?Findings: ?     The perianal and digital rectal examinations were normal. ?     Non-bleeding internal hemorrhoids were found during endoscopy. ?     A few small-mouthed diverticula were found in the sigmoid colon. ?     The terminal ileum appeared normal. ?     The exam was otherwise without abnormality. ?Impression:               - Non-bleeding internal hemorrhoids. ?                          - Diverticulosis in the sigmoid colon. ?                          - The examined portion of the ileum was normal. ?                          -  The examination was otherwise normal. ?                          - No specimens collected. ?Moderate Sedation: ?     Per Anesthesia Care ?Recommendation:           - Patient has a contact number available for  ?                          emergencies. The signs and symptoms of potential  ?                          delayed complications were discussed with the  ?                          patient. Return to normal activities tomorrow.  ?                          Written discharge instructions were provided to the  ?                          patient. ?                          - Resume previous diet. ?                          - Continue present  medications. ?                          - Repeat colonoscopy in 10 years for screening  ?                          purposes. ?                          - Return to GI clinic PRN. ?Procedure Code(s):        --- Professional --- ?                          Z6109, Colorectal cancer screening; colonoscopy on  ?                          individual not meeting criteria for high risk ?Diagnosis Code(s):        --- Professional --- ?                          Z12.11, Encounter for screening for malignant  ?                          neoplasm of colon ?                          K64.8, Other hemorrhoids ?                          K57.30, Diverticulosis of large intestine without  ?  perforation or abscess without bleeding ?CPT copyright 2019 [employer] Medical Association. All rights reserved. ?The codes documented in this report are preliminary and upon coder review may  ?be revised to meet current compliance requirements. ?Elon Alas. Abbey Chatters, DO ?Elon Alas. [place], DO ?03/19/2022 11:56:43 AM ?This report has been signed electronically. ?Number of Addenda: 0 ?

## 2022-03-19 NOTE — Anesthesia Preprocedure Evaluation (Signed)
Anesthesia Evaluation  ?Patient identified by MRN, date of birth, ID band ?Patient awake ? ? ? ?Reviewed: ?Allergy & Precautions, H&P , NPO status , Patient's Chart, lab work & pertinent test results, reviewed documented beta blocker date and time  ? ?Airway ?Mallampati: II ? ?TM Distance: >3 FB ?Neck ROM: full ? ? ? Dental ?no notable dental hx. ? ?  ?Pulmonary ?neg pulmonary ROS,  ?  ?Pulmonary exam normal ?breath sounds clear to auscultation ? ? ? ? ? ? Cardiovascular ?Exercise Tolerance: Good ?hypertension, negative cardio ROS ? ? ?Rhythm:regular Rate:Normal ? ? ?  ?Neuro/Psych ?CVA, No Residual Symptoms negative psych ROS  ? GI/Hepatic ?negative GI ROS, Neg liver ROS,   ?Endo/Other  ?negative endocrine ROS ? Renal/GU ?Renal disease  ?negative genitourinary ?  ?Musculoskeletal ? ? Abdominal ?  ?Peds ? Hematology ?negative hematology ROS ?(+)   ?Anesthesia Other Findings ? ? Reproductive/Obstetrics ?negative OB ROS ? ?  ? ? ? ? ? ? ? ? ? ? ? ? ? ?  ?  ? ? ? ? ? ? ? ? ?Anesthesia Physical ?Anesthesia Plan ? ?ASA: 3 ? ?Anesthesia Plan: General  ? ?Post-op Pain Management:   ? ?Induction:  ? ?PONV Risk Score and Plan: Propofol infusion ? ?Airway Management Planned:  ? ?Additional Equipment:  ? ?Intra-op Plan:  ? ?Post-operative Plan:  ? ?Informed Consent: I have reviewed the patients History and Physical, chart, labs and discussed the procedure including the risks, benefits and alternatives for the proposed anesthesia with the patient or authorized representative who has indicated his/her understanding and acceptance.  ? ? ? ?Dental Advisory Given ? ?Plan Discussed with: CRNA ? ?Anesthesia Plan Comments:   ? ? ? ? ? ? ?Anesthesia Quick Evaluation ? ?

## 2022-03-19 NOTE — Interval H&P Note (Signed)
History and Physical Interval Note: ? ?03/19/2022 ?10:56 AM ? ?Lee Robinson  has presented today for surgery, with the diagnosis of screening colonoscopy.  The various methods of treatment have been discussed with the patient and family. After consideration of risks, benefits and other options for treatment, the patient has consented to  Procedure(s) with comments: ?COLONOSCOPY WITH PROPOFOL (N/A) - 9:00am as a surgical intervention.  The patient's history has been reviewed, patient examined, no change in status, stable for surgery.  I have reviewed the patient's chart and labs.  Questions were answered to the patient's satisfaction.   ? ? ?Eloise Harman ? ? ?

## 2022-03-19 NOTE — Telephone Encounter (Signed)
Called pt, he is on way to hospital. He has already spoke to someone at hospital. ?

## 2022-03-19 NOTE — Discharge Instructions (Addendum)

## 2022-03-19 NOTE — Telephone Encounter (Signed)
Pamala Hurry, RN from short stay called to let us know patient was a no show for procedure.  ?

## 2022-03-19 NOTE — OR Nursing (Signed)
Patient arrived and asking if he can still have his procedure. He did his prep. He was informed that he will need someone to be with him at time of discharge. Spoke with Doctor Abbey Chatters, and ok to proceed with case and let him know that we will work him in.  Patient left facility to get a person to be with him after the procedure. To return. And we will do his procedure. ?

## 2022-03-21 ENCOUNTER — Encounter (HOSPITAL_COMMUNITY): Payer: Self-pay | Admitting: Internal Medicine

## 2022-04-10 DIAGNOSIS — I10 Essential (primary) hypertension: Secondary | ICD-10-CM | POA: Diagnosis not present

## 2022-04-10 DIAGNOSIS — N1831 Chronic kidney disease, stage 3a: Secondary | ICD-10-CM | POA: Diagnosis not present

## 2022-05-11 DIAGNOSIS — N1831 Chronic kidney disease, stage 3a: Secondary | ICD-10-CM | POA: Diagnosis not present

## 2022-05-11 DIAGNOSIS — I10 Essential (primary) hypertension: Secondary | ICD-10-CM | POA: Diagnosis not present

## 2022-06-26 DIAGNOSIS — N1831 Chronic kidney disease, stage 3a: Secondary | ICD-10-CM | POA: Diagnosis not present

## 2022-06-26 DIAGNOSIS — G8190 Hemiplegia, unspecified affecting unspecified side: Secondary | ICD-10-CM | POA: Diagnosis not present

## 2022-06-26 DIAGNOSIS — I10 Essential (primary) hypertension: Secondary | ICD-10-CM | POA: Diagnosis not present

## 2022-06-26 DIAGNOSIS — R4781 Slurred speech: Secondary | ICD-10-CM | POA: Diagnosis not present

## 2022-07-27 DIAGNOSIS — G8191 Hemiplegia, unspecified affecting right dominant side: Secondary | ICD-10-CM | POA: Diagnosis not present

## 2022-07-27 DIAGNOSIS — I10 Essential (primary) hypertension: Secondary | ICD-10-CM | POA: Diagnosis not present

## 2022-08-27 DIAGNOSIS — I10 Essential (primary) hypertension: Secondary | ICD-10-CM | POA: Diagnosis not present

## 2022-08-27 DIAGNOSIS — N1831 Chronic kidney disease, stage 3a: Secondary | ICD-10-CM | POA: Diagnosis not present

## 2022-09-26 DIAGNOSIS — N1831 Chronic kidney disease, stage 3a: Secondary | ICD-10-CM | POA: Diagnosis not present

## 2022-09-26 DIAGNOSIS — I10 Essential (primary) hypertension: Secondary | ICD-10-CM | POA: Diagnosis not present

## 2022-10-27 DIAGNOSIS — N1831 Chronic kidney disease, stage 3a: Secondary | ICD-10-CM | POA: Diagnosis not present

## 2022-10-27 DIAGNOSIS — I10 Essential (primary) hypertension: Secondary | ICD-10-CM | POA: Diagnosis not present

## 2022-11-26 DIAGNOSIS — I10 Essential (primary) hypertension: Secondary | ICD-10-CM | POA: Diagnosis not present

## 2022-11-26 DIAGNOSIS — N1831 Chronic kidney disease, stage 3a: Secondary | ICD-10-CM | POA: Diagnosis not present

## 2022-12-17 DIAGNOSIS — N1831 Chronic kidney disease, stage 3a: Secondary | ICD-10-CM | POA: Diagnosis not present

## 2022-12-17 DIAGNOSIS — G8190 Hemiplegia, unspecified affecting unspecified side: Secondary | ICD-10-CM | POA: Diagnosis not present

## 2022-12-17 DIAGNOSIS — Z1331 Encounter for screening for depression: Secondary | ICD-10-CM | POA: Diagnosis not present

## 2022-12-17 DIAGNOSIS — R4781 Slurred speech: Secondary | ICD-10-CM | POA: Diagnosis not present

## 2022-12-17 DIAGNOSIS — N529 Male erectile dysfunction, unspecified: Secondary | ICD-10-CM | POA: Diagnosis not present

## 2022-12-17 DIAGNOSIS — Z0001 Encounter for general adult medical examination with abnormal findings: Secondary | ICD-10-CM | POA: Diagnosis not present

## 2022-12-17 DIAGNOSIS — I10 Essential (primary) hypertension: Secondary | ICD-10-CM | POA: Diagnosis not present

## 2022-12-17 DIAGNOSIS — Z1389 Encounter for screening for other disorder: Secondary | ICD-10-CM | POA: Diagnosis not present

## 2023-01-17 DIAGNOSIS — I10 Essential (primary) hypertension: Secondary | ICD-10-CM | POA: Diagnosis not present

## 2023-01-17 DIAGNOSIS — N1831 Chronic kidney disease, stage 3a: Secondary | ICD-10-CM | POA: Diagnosis not present

## 2023-02-15 DIAGNOSIS — I10 Essential (primary) hypertension: Secondary | ICD-10-CM | POA: Diagnosis not present

## 2023-02-15 DIAGNOSIS — N1831 Chronic kidney disease, stage 3a: Secondary | ICD-10-CM | POA: Diagnosis not present

## 2023-03-18 DIAGNOSIS — N1831 Chronic kidney disease, stage 3a: Secondary | ICD-10-CM | POA: Diagnosis not present

## 2023-03-18 DIAGNOSIS — I10 Essential (primary) hypertension: Secondary | ICD-10-CM | POA: Diagnosis not present

## 2023-04-17 DIAGNOSIS — N1831 Chronic kidney disease, stage 3a: Secondary | ICD-10-CM | POA: Diagnosis not present

## 2023-04-17 DIAGNOSIS — I10 Essential (primary) hypertension: Secondary | ICD-10-CM | POA: Diagnosis not present

## 2023-05-18 DIAGNOSIS — N1831 Chronic kidney disease, stage 3a: Secondary | ICD-10-CM | POA: Diagnosis not present

## 2023-05-18 DIAGNOSIS — I10 Essential (primary) hypertension: Secondary | ICD-10-CM | POA: Diagnosis not present

## 2023-05-24 DIAGNOSIS — N1831 Chronic kidney disease, stage 3a: Secondary | ICD-10-CM | POA: Diagnosis not present

## 2023-05-24 DIAGNOSIS — N529 Male erectile dysfunction, unspecified: Secondary | ICD-10-CM | POA: Diagnosis not present

## 2023-05-24 DIAGNOSIS — I10 Essential (primary) hypertension: Secondary | ICD-10-CM | POA: Diagnosis not present

## 2023-06-11 DIAGNOSIS — R4781 Slurred speech: Secondary | ICD-10-CM | POA: Diagnosis not present

## 2023-06-11 DIAGNOSIS — I10 Essential (primary) hypertension: Secondary | ICD-10-CM | POA: Diagnosis not present

## 2023-06-11 DIAGNOSIS — N1831 Chronic kidney disease, stage 3a: Secondary | ICD-10-CM | POA: Diagnosis not present

## 2023-06-12 IMAGING — DX DG CHEST 1V PORT
1 series · 1 of 1 positions shown · non-contrast
Comparison: None.

CLINICAL DATA: Syncopal episode.  Loss of consciousness.

EXAM:
PORTABLE CHEST 1 VIEW

[chest ap]
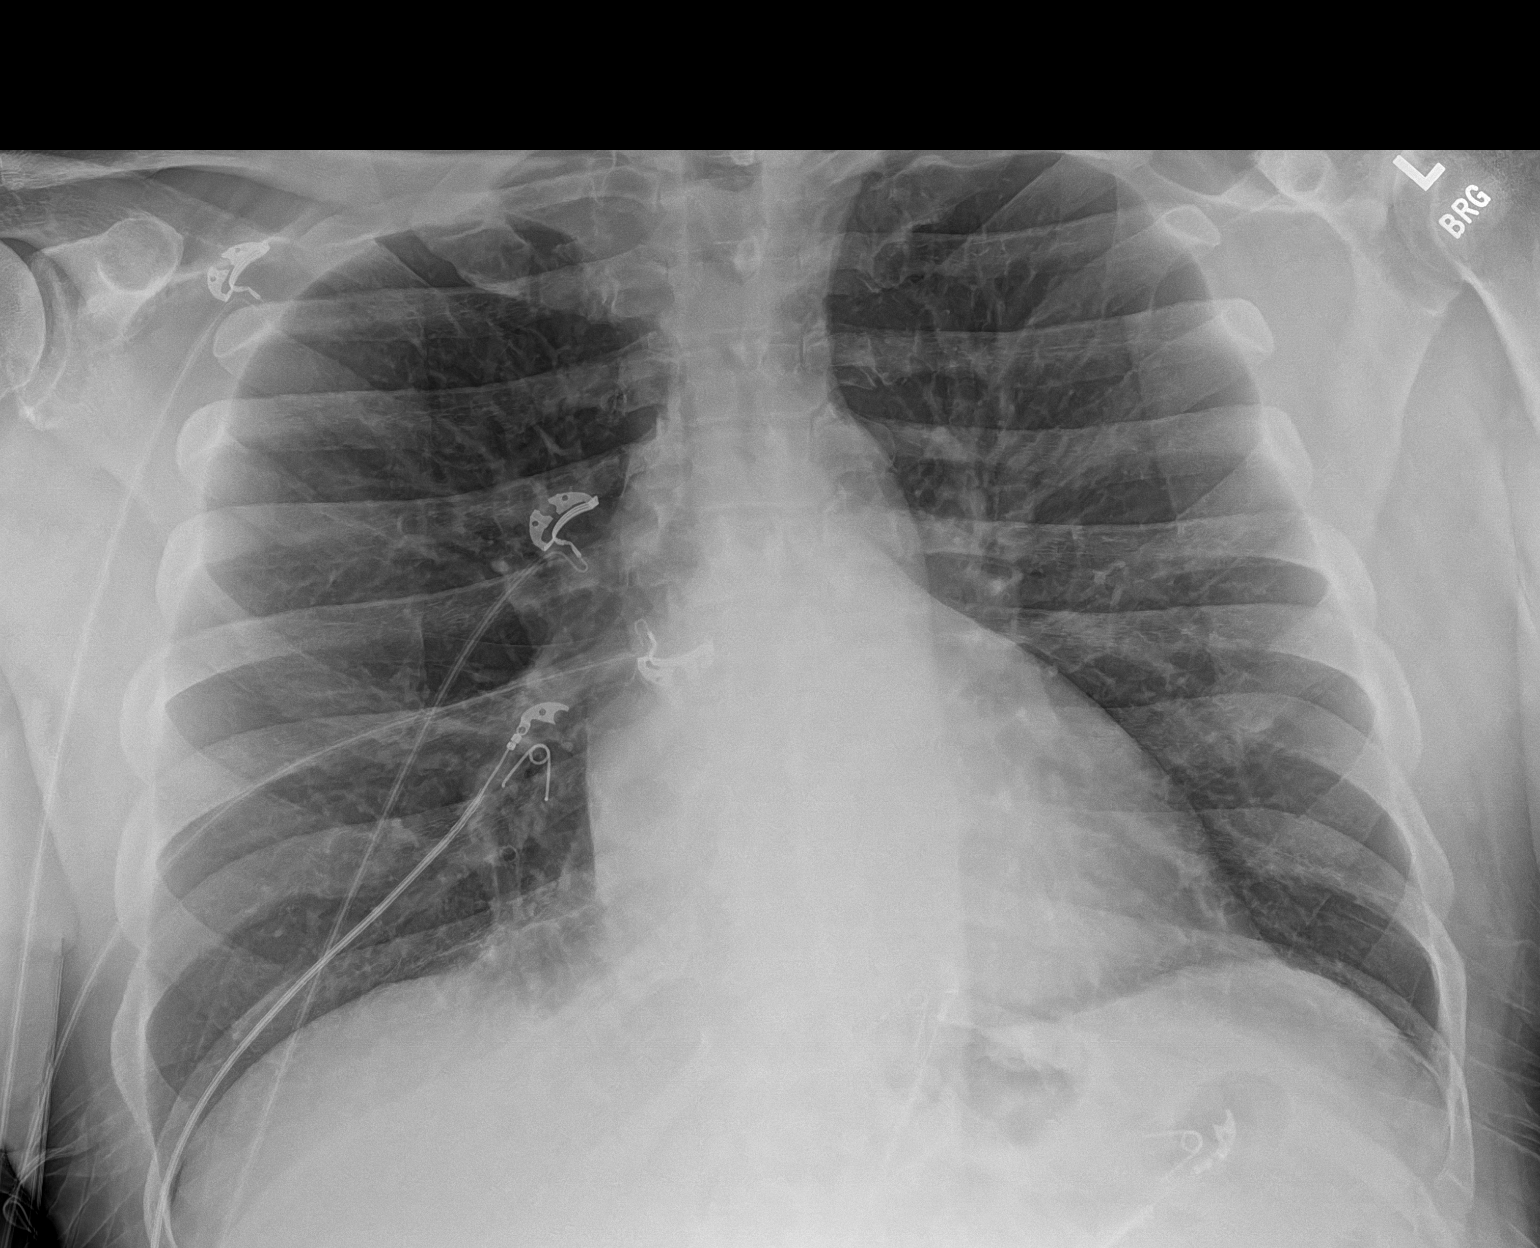

[1 of 1 positions shown; findings below may reference images not displayed]

FINDINGS: 2298 hours. Lordotic positioning. The heart size and mediastinal
contours are normal. The lungs are clear. There is no pleural
effusion or pneumothorax. No acute osseous findings are identified.
Telemetry leads overlie the chest.
IMPRESSION: No active cardiopulmonary process.

## 2023-07-12 DIAGNOSIS — N1831 Chronic kidney disease, stage 3a: Secondary | ICD-10-CM | POA: Diagnosis not present

## 2023-07-12 DIAGNOSIS — I10 Essential (primary) hypertension: Secondary | ICD-10-CM | POA: Diagnosis not present

## 2023-08-12 DIAGNOSIS — N1831 Chronic kidney disease, stage 3a: Secondary | ICD-10-CM | POA: Diagnosis not present

## 2023-08-12 DIAGNOSIS — I10 Essential (primary) hypertension: Secondary | ICD-10-CM | POA: Diagnosis not present

## 2023-08-21 IMAGING — US US RENAL
1 series · 14 of 25 positions shown · non-contrast
Comparison: None.

CLINICAL DATA: 65-year-old male with acute renal insufficiency.
Stage III B chronic kidney disease.

EXAM:
RENAL / URINARY TRACT ULTRASOUND COMPLETE

[Series 1: us renal · 0.25mm/px · 14 of 78 slices shown]
[im 1/78]
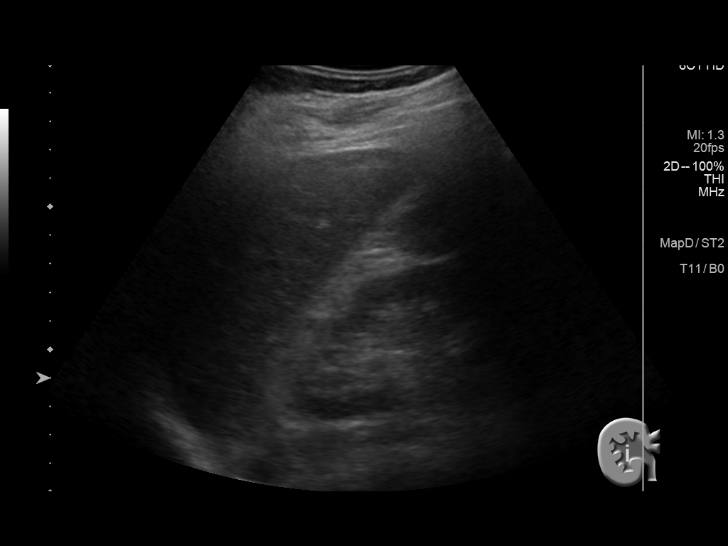
[im 7/78]
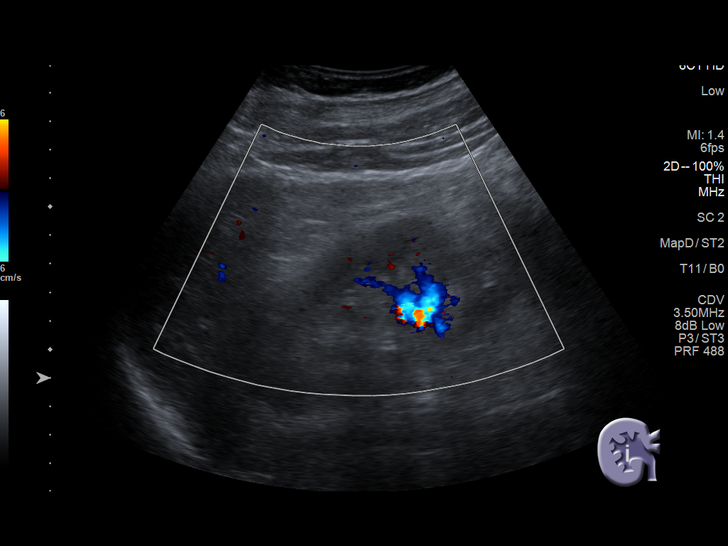
[im 13/78]
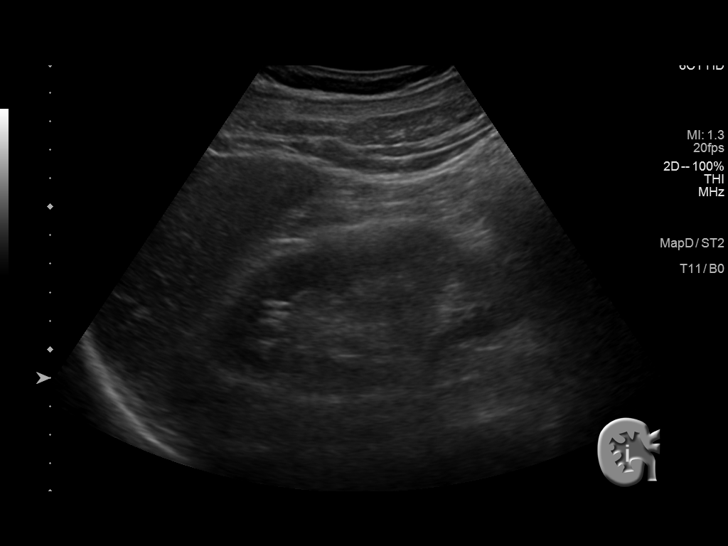
[im 20/78]
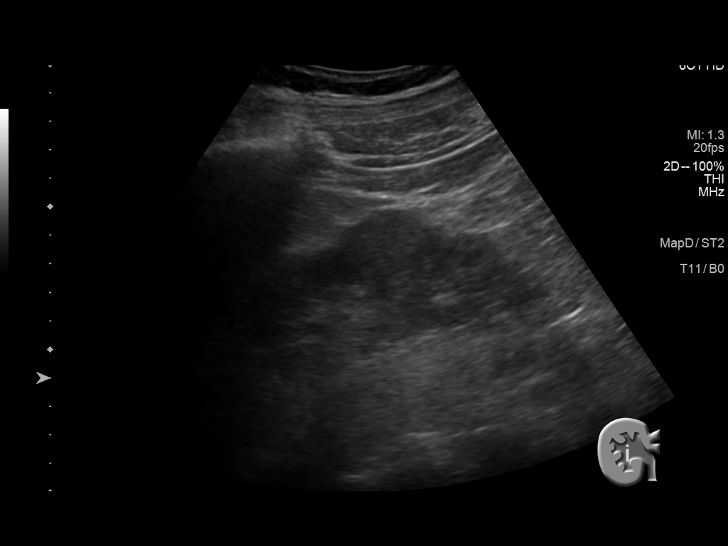
[im 26/78]
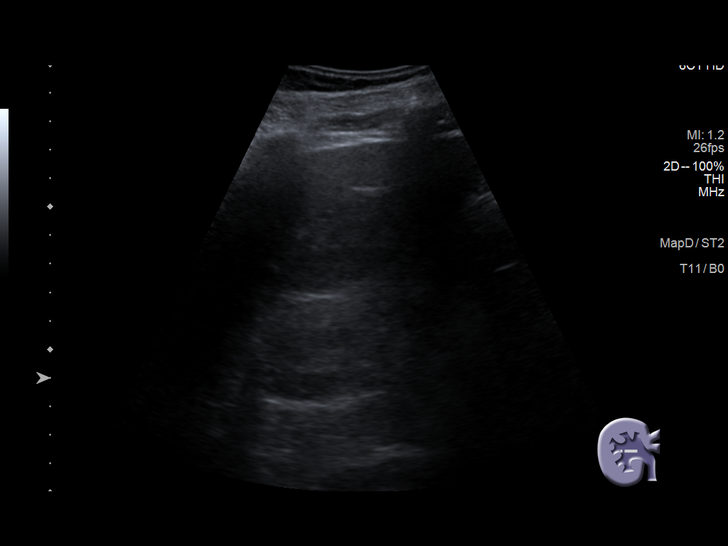
[im 29/78]
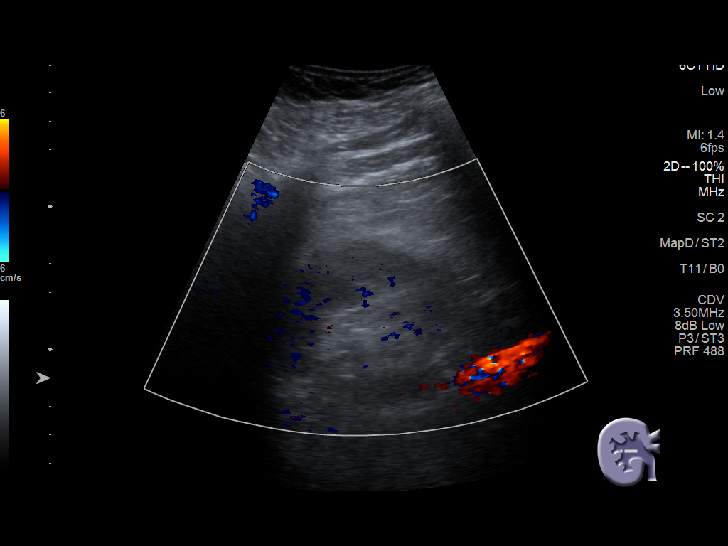
[im 36/78]
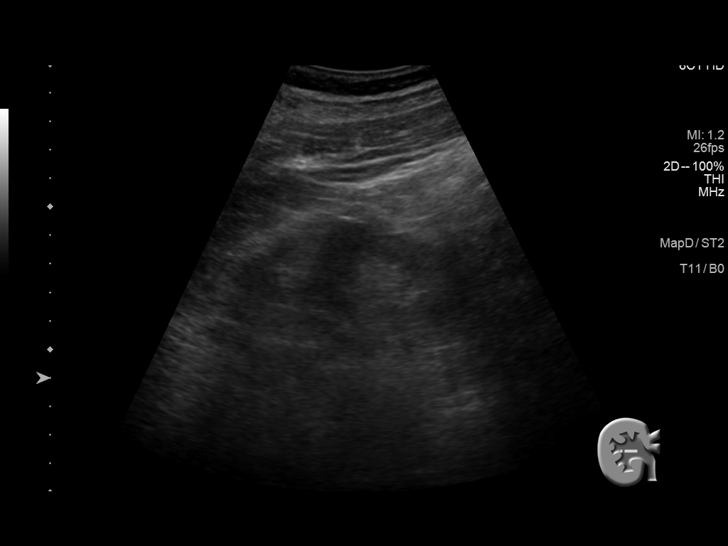
[im 42/78]
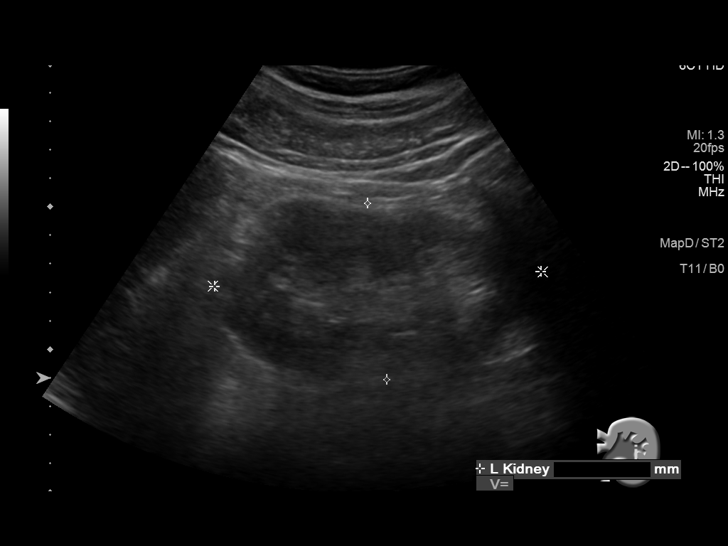
[im 49/78]
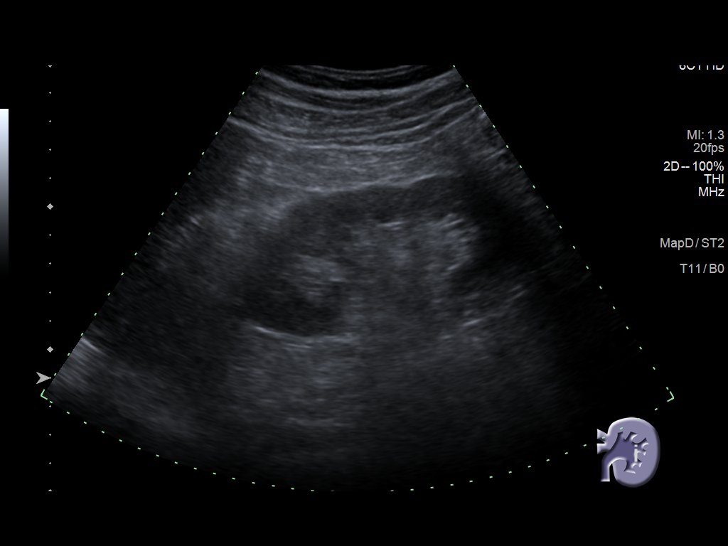
[im 52/78]
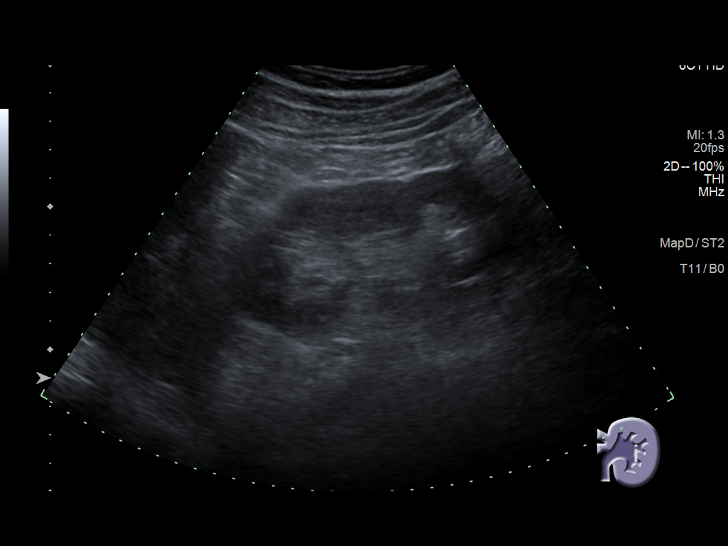
[im 58/78]
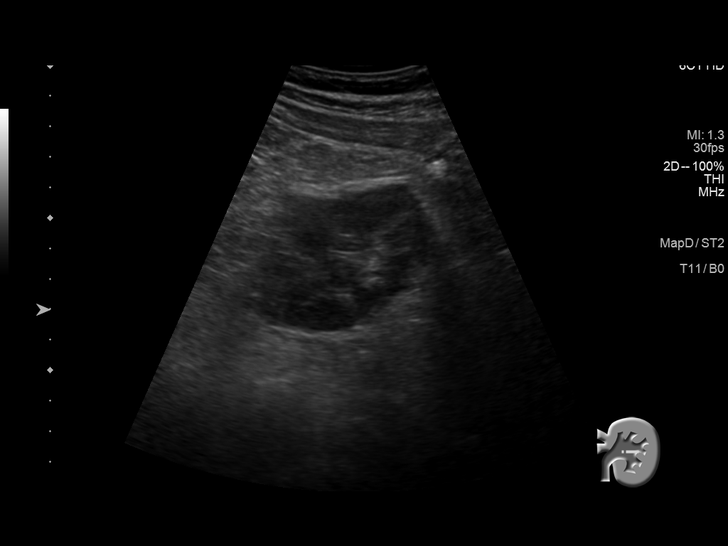
[im 65/78]
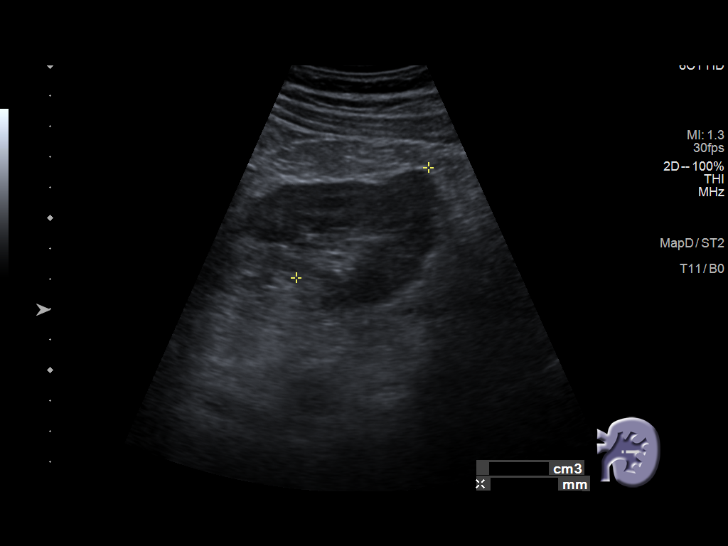
[im 71/78]
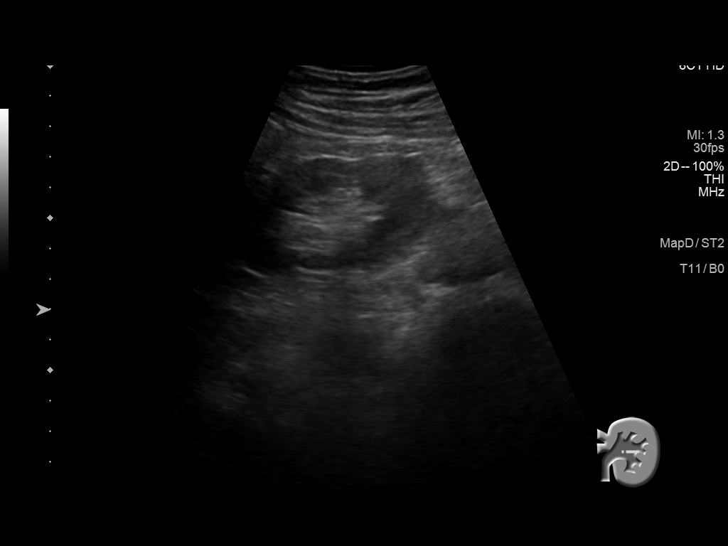
[im 78/78]
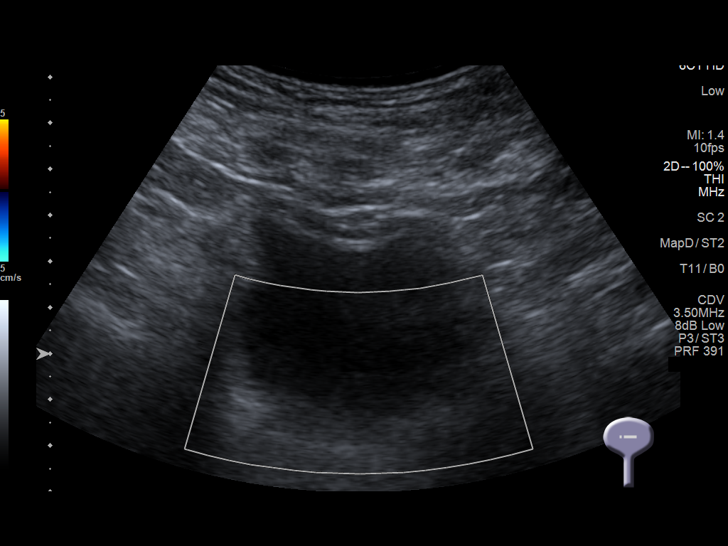

[14 of 25 positions shown; findings below may reference images not displayed]

FINDINGS: Right Kidney:

Renal measurements: 11.3 x 4.7 x 6.3 cm = volume: 178 mL. Preserved
cortical echogenicity (image 2) with mild cortical thinning. No
right hydronephrosis or renal mass.

Left Kidney:

Renal measurements: 11.6 x 6.2 x 5.7 cm = volume: 213 mL. Preserved
cortical echogenicity. Less renal cortical thinning on this side
(image 49). No left hydronephrosis or renal mass.

Bladder:

Diminutive.  Appears normal for degree of bladder distention.

Other:

None.
IMPRESSION: No acute renal abnormality.

Evidence of chronic renal disease in the form of thinning cortex,
more so on the right.

## 2023-09-11 DIAGNOSIS — I10 Essential (primary) hypertension: Secondary | ICD-10-CM | POA: Diagnosis not present

## 2023-09-11 DIAGNOSIS — N1831 Chronic kidney disease, stage 3a: Secondary | ICD-10-CM | POA: Diagnosis not present

## 2023-10-04 IMAGING — DX DG BONE SURVEY MET
10 series · 10 of 10 positions shown · non-contrast
Comparison: None.

CLINICAL DATA: MGUS

EXAM:
METASTATIC BONE SURVEY

[skull lat]
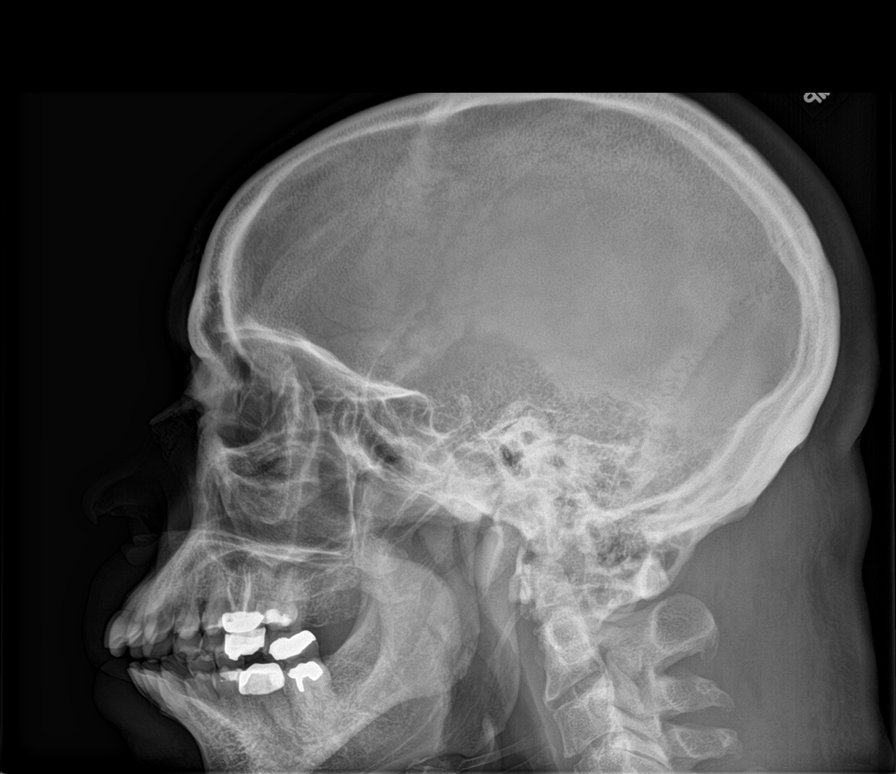

[shoulder ap (1 of 2)]
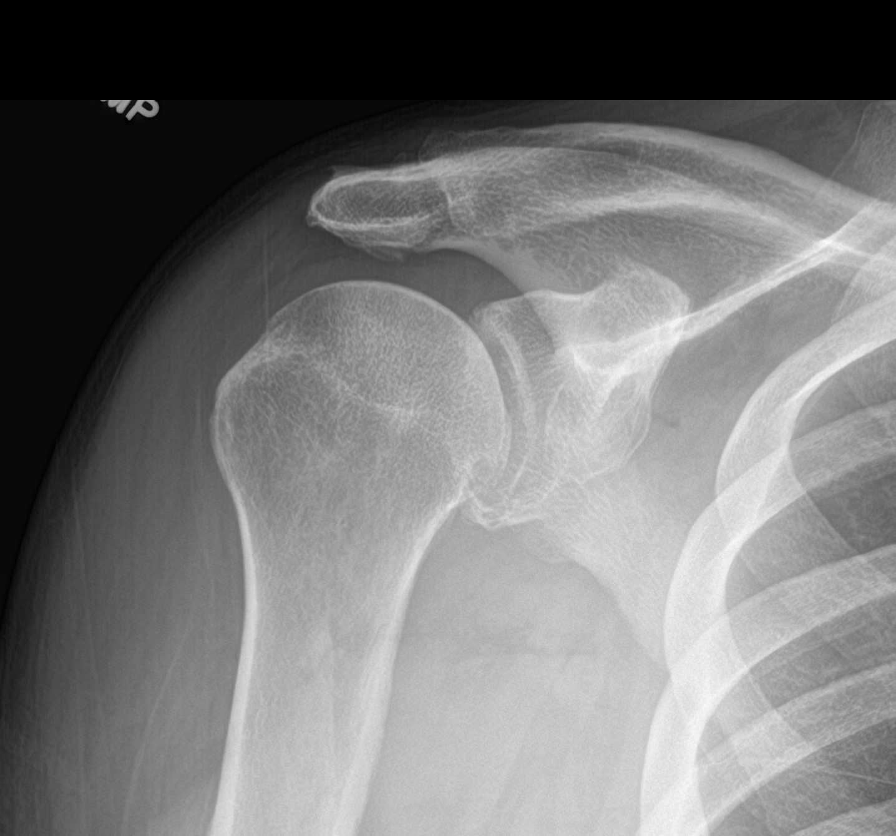

[shoulder ap (2 of 2)]
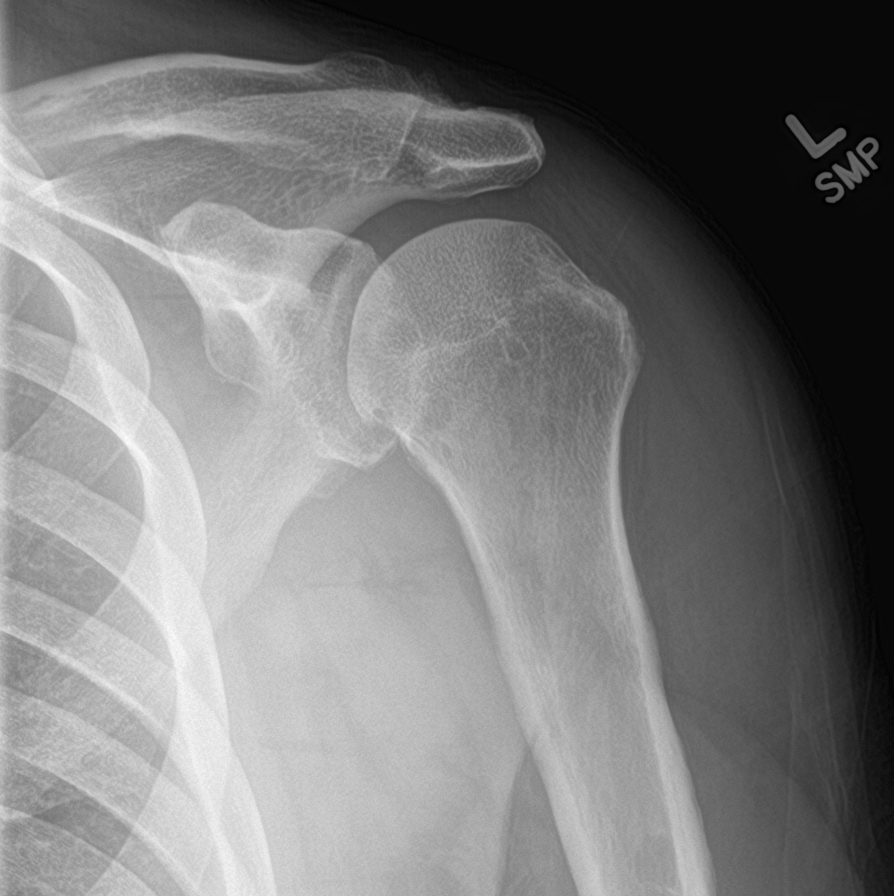

[humerus ap (1 of 2)]
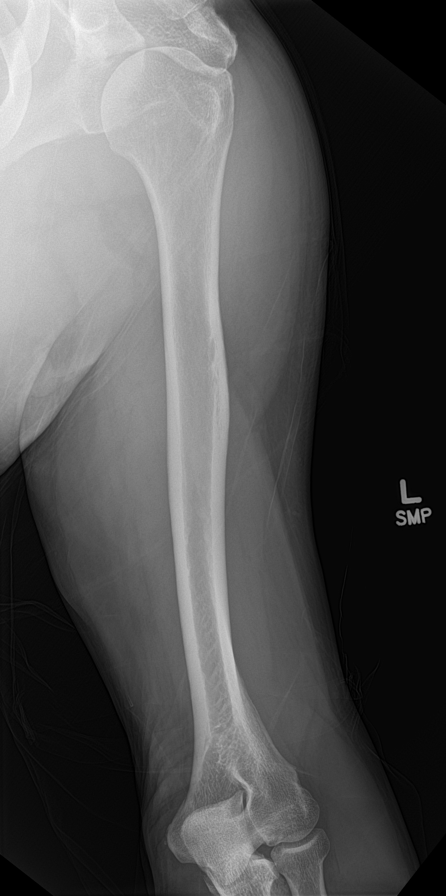

[humerus ap (2 of 2)]
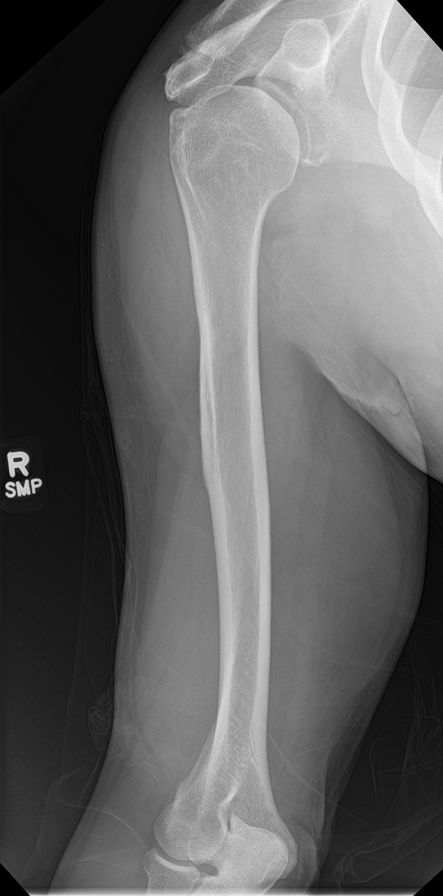

[forearm ap (1 of 2)]
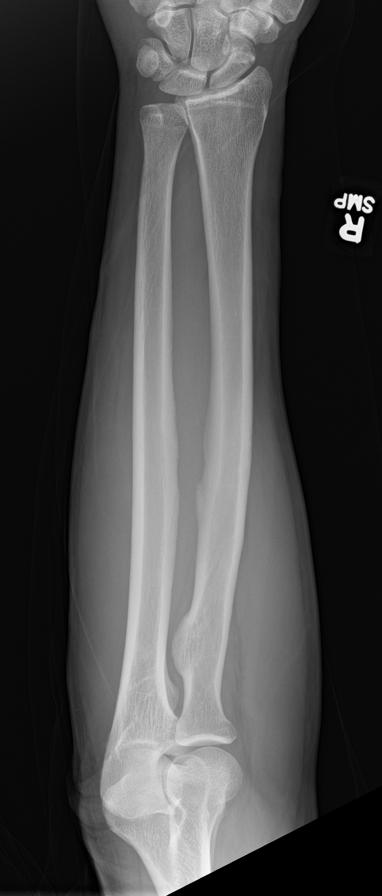

[forearm ap (2 of 2)]
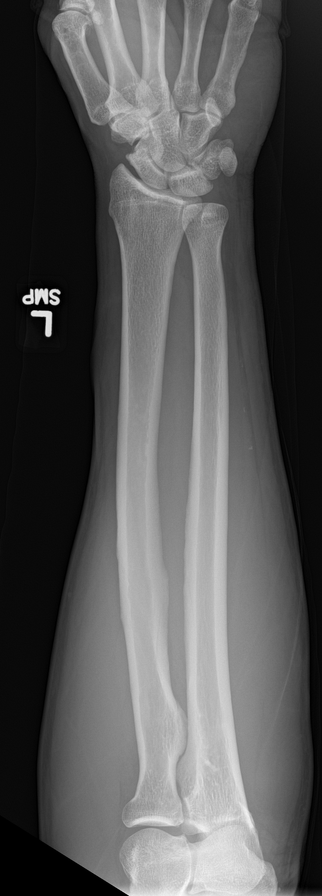

[c-spine ap]
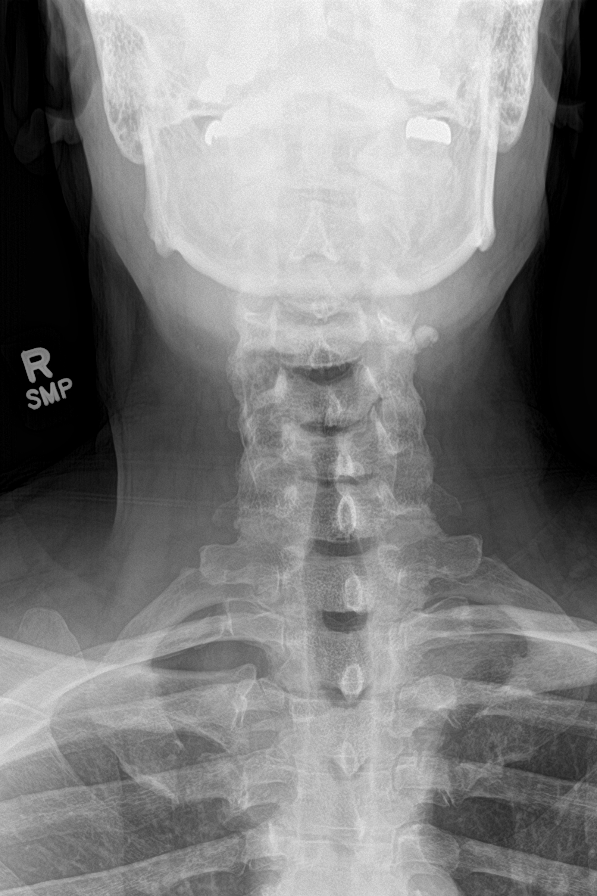

[c-spine lat]
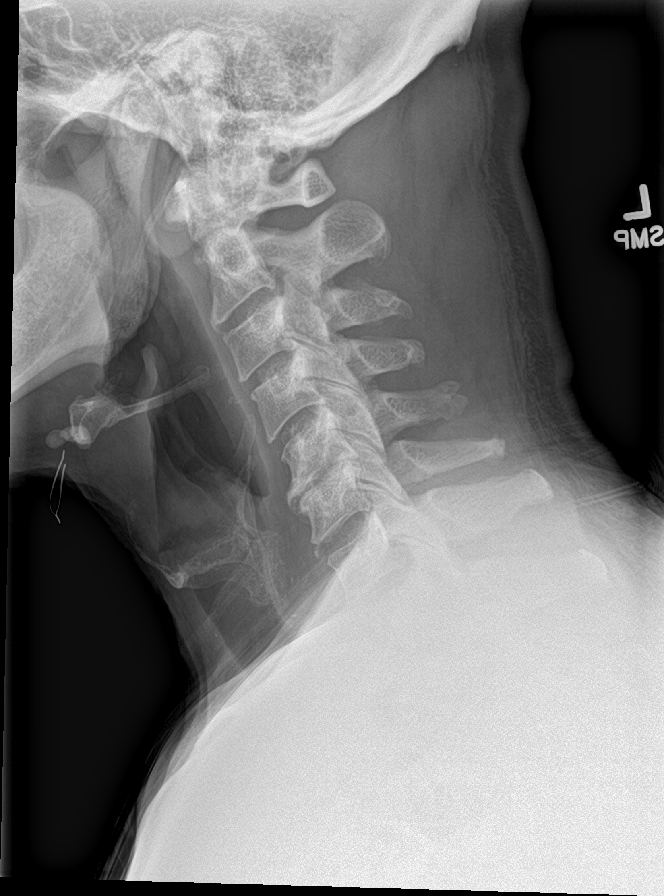

[t-spine lat]
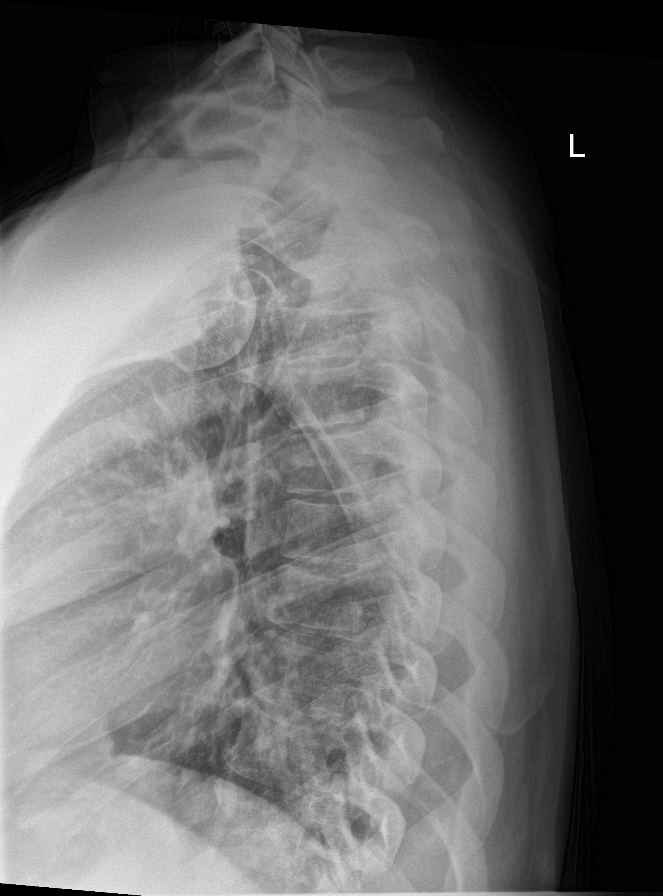

[10 of 10 positions shown; findings below may reference images not displayed]

FINDINGS: Images of the axial and appendicular skeleton are performed.

Calvarium: No suspicious focal bony lesions. No acute osseous
abnormality. Soft tissues are unremarkable.

Chest and Abdomen cardiac and mediastinal contours are within normal
limits. Lungs are clear. No gas-filled dilated loops of bowel seen
in the visualized abdomen and pelvis.No suspicious focal bony
lesions. No acute osseous abnormality. Superficial soft tissues are
unremarkable.

Spine:Multilevel mild-to-moderate degenerative disc disease, most
pronounced at C5-C6 and L5-S1.No suspicious focal bony lesions. No
acute osseous abnormality. Soft tissues are unremarkable.

UPPER extremities:No suspicious focal bony lesions. No acute osseous
abnormality. Soft tissues are unremarkable.

Pelvis and LOWER extremities:Severe degenerative changes of the
right hip joint with subchondral cystic change. Moderate
degenerative changes of the left hip joint. Mild degenerative
changes of the pubic symphysis. Lucencies overlying the bilateral
iliac bones, likely due to overlying bowel gas. No suspicious focal
bony lesions. No acute osseous abnormality. Soft tissues are
unremarkable.
IMPRESSION: No suspicious focal bony lesions.

## 2023-10-12 DIAGNOSIS — N1831 Chronic kidney disease, stage 3a: Secondary | ICD-10-CM | POA: Diagnosis not present

## 2023-10-12 DIAGNOSIS — I10 Essential (primary) hypertension: Secondary | ICD-10-CM | POA: Diagnosis not present

## 2023-11-11 DIAGNOSIS — I10 Essential (primary) hypertension: Secondary | ICD-10-CM | POA: Diagnosis not present

## 2023-11-11 DIAGNOSIS — N1831 Chronic kidney disease, stage 3a: Secondary | ICD-10-CM | POA: Diagnosis not present

## 2023-12-10 DIAGNOSIS — N1831 Chronic kidney disease, stage 3a: Secondary | ICD-10-CM | POA: Diagnosis not present

## 2023-12-10 DIAGNOSIS — N529 Male erectile dysfunction, unspecified: Secondary | ICD-10-CM | POA: Diagnosis not present

## 2023-12-10 DIAGNOSIS — Z1389 Encounter for screening for other disorder: Secondary | ICD-10-CM | POA: Diagnosis not present

## 2023-12-10 DIAGNOSIS — Z1331 Encounter for screening for depression: Secondary | ICD-10-CM | POA: Diagnosis not present

## 2023-12-10 DIAGNOSIS — M25511 Pain in right shoulder: Secondary | ICD-10-CM | POA: Diagnosis not present

## 2023-12-10 DIAGNOSIS — G8190 Hemiplegia, unspecified affecting unspecified side: Secondary | ICD-10-CM | POA: Diagnosis not present

## 2023-12-10 DIAGNOSIS — I10 Essential (primary) hypertension: Secondary | ICD-10-CM | POA: Diagnosis not present

## 2023-12-10 DIAGNOSIS — Z0001 Encounter for general adult medical examination with abnormal findings: Secondary | ICD-10-CM | POA: Diagnosis not present

## 2024-01-10 DIAGNOSIS — N1831 Chronic kidney disease, stage 3a: Secondary | ICD-10-CM | POA: Diagnosis not present

## 2024-01-10 DIAGNOSIS — I10 Essential (primary) hypertension: Secondary | ICD-10-CM | POA: Diagnosis not present

## 2024-02-07 DIAGNOSIS — I10 Essential (primary) hypertension: Secondary | ICD-10-CM | POA: Diagnosis not present

## 2024-02-07 DIAGNOSIS — N1831 Chronic kidney disease, stage 3a: Secondary | ICD-10-CM | POA: Diagnosis not present

## 2024-03-09 DIAGNOSIS — I10 Essential (primary) hypertension: Secondary | ICD-10-CM | POA: Diagnosis not present

## 2024-03-09 DIAGNOSIS — N1831 Chronic kidney disease, stage 3a: Secondary | ICD-10-CM | POA: Diagnosis not present

## 2024-04-08 DIAGNOSIS — I10 Essential (primary) hypertension: Secondary | ICD-10-CM | POA: Diagnosis not present

## 2024-04-08 DIAGNOSIS — N1831 Chronic kidney disease, stage 3a: Secondary | ICD-10-CM | POA: Diagnosis not present

## 2024-05-09 DIAGNOSIS — N1831 Chronic kidney disease, stage 3a: Secondary | ICD-10-CM | POA: Diagnosis not present

## 2024-05-09 DIAGNOSIS — I10 Essential (primary) hypertension: Secondary | ICD-10-CM | POA: Diagnosis not present

## 2024-05-22 DIAGNOSIS — I10 Essential (primary) hypertension: Secondary | ICD-10-CM | POA: Diagnosis not present

## 2024-05-22 DIAGNOSIS — N1831 Chronic kidney disease, stage 3a: Secondary | ICD-10-CM | POA: Diagnosis not present

## 2024-05-22 DIAGNOSIS — Z0001 Encounter for general adult medical examination with abnormal findings: Secondary | ICD-10-CM | POA: Diagnosis not present

## 2024-06-03 DIAGNOSIS — N1831 Chronic kidney disease, stage 3a: Secondary | ICD-10-CM | POA: Diagnosis not present

## 2024-06-03 DIAGNOSIS — N529 Male erectile dysfunction, unspecified: Secondary | ICD-10-CM | POA: Diagnosis not present

## 2024-06-03 DIAGNOSIS — G8190 Hemiplegia, unspecified affecting unspecified side: Secondary | ICD-10-CM | POA: Diagnosis not present

## 2024-06-03 DIAGNOSIS — I10 Essential (primary) hypertension: Secondary | ICD-10-CM | POA: Diagnosis not present

## 2024-06-10 DIAGNOSIS — Z91199 Patient's noncompliance with other medical treatment and regimen due to unspecified reason: Secondary | ICD-10-CM | POA: Diagnosis not present

## 2024-06-10 DIAGNOSIS — G8190 Hemiplegia, unspecified affecting unspecified side: Secondary | ICD-10-CM | POA: Diagnosis not present

## 2024-06-10 DIAGNOSIS — I10 Essential (primary) hypertension: Secondary | ICD-10-CM | POA: Diagnosis not present

## 2024-07-11 DIAGNOSIS — I10 Essential (primary) hypertension: Secondary | ICD-10-CM | POA: Diagnosis not present

## 2024-07-11 DIAGNOSIS — N1831 Chronic kidney disease, stage 3a: Secondary | ICD-10-CM | POA: Diagnosis not present

## 2024-07-22 DIAGNOSIS — G8190 Hemiplegia, unspecified affecting unspecified side: Secondary | ICD-10-CM | POA: Diagnosis not present

## 2024-07-22 DIAGNOSIS — U070 Vaping-related disorder: Secondary | ICD-10-CM | POA: Diagnosis not present

## 2024-07-22 DIAGNOSIS — H6120 Impacted cerumen, unspecified ear: Secondary | ICD-10-CM | POA: Diagnosis not present

## 2024-07-22 DIAGNOSIS — I10 Essential (primary) hypertension: Secondary | ICD-10-CM | POA: Diagnosis not present

## 2024-08-22 DIAGNOSIS — I10 Essential (primary) hypertension: Secondary | ICD-10-CM | POA: Diagnosis not present

## 2024-08-22 DIAGNOSIS — N1831 Chronic kidney disease, stage 3a: Secondary | ICD-10-CM | POA: Diagnosis not present

## 2024-09-21 DIAGNOSIS — N1831 Chronic kidney disease, stage 3a: Secondary | ICD-10-CM | POA: Diagnosis not present

## 2024-09-21 DIAGNOSIS — I10 Essential (primary) hypertension: Secondary | ICD-10-CM | POA: Diagnosis not present

## 2024-10-22 DIAGNOSIS — I10 Essential (primary) hypertension: Secondary | ICD-10-CM | POA: Diagnosis not present

## 2024-10-22 DIAGNOSIS — N1831 Chronic kidney disease, stage 3a: Secondary | ICD-10-CM | POA: Diagnosis not present
